# Patient Record
Sex: Female | Born: 1985 | Hispanic: Yes | Marital: Married | Smoking: Never smoker
Health system: Southern US, Community
[De-identification: ages and names within clinical notes are randomized; demographics above are authoritative.]

## PROBLEM LIST (undated history)

## (undated) ENCOUNTER — Inpatient Hospital Stay: Payer: Self-pay

---

## 2015-03-09 HISTORY — PX: PLACEMENT OF BREAST IMPLANTS: SHX6334

## 2019-05-26 ENCOUNTER — Ambulatory Visit: Payer: Self-pay | Attending: Internal Medicine

## 2019-05-26 DIAGNOSIS — Z23 Encounter for immunization: Secondary | ICD-10-CM

## 2019-05-26 NOTE — Progress Notes (Signed)
   Covid-19 Vaccination Clinic  Name:  Courtney Newman    MRN: [id number] DOB: [birth year]  05/26/2019  [name] was observed post Covid-19 immunization for 15 minutes without incident. She was provided with Vaccine Information Sheet and instruction to access the V-Safe system.   [name] was instructed to call 911 with any severe reactions post vaccine: Marland Kitchen Difficulty breathing  . Swelling of face and throat  . A fast heartbeat  . A bad rash all over body  . Dizziness and weakness   Immunizations Administered    Name Date Dose VIS Date Route   Pfizer COVID-19 Vaccine 05/26/2019  9:09 AM 0.3 mL 02/16/2019 Intramuscular   Manufacturer: [employer], [employer]   Lot: [id number]   NDC: [address]-7

## 2019-06-16 ENCOUNTER — Ambulatory Visit: Payer: Self-pay | Attending: Internal Medicine

## 2019-06-16 DIAGNOSIS — Z23 Encounter for immunization: Secondary | ICD-10-CM

## 2019-06-16 NOTE — Progress Notes (Signed)
   Covid-19 Vaccination Clinic  Name:  Zakiah Gauthreaux    MRN: [id number] DOB: 02-24-1986  06/16/2019  Ms. Poole was observed post Covid-19 immunization for 15 minutes   without incident. She was provided with Vaccine Information Sheet and instruction to access the V-Safe system.   Ms. Pak was instructed to call 911 with any severe reactions post vaccine: Marland Kitchen Difficulty breathing  . Swelling of face and throat  . A fast heartbeat  . A bad rash all over body  . Dizziness and weakness   Immunizations Administered    Name Date Dose VIS Date Route   Pfizer COVID-19 Vaccine 06/16/2019  9:22 AM 0.3 mL 02/16/2019 Intramuscular   Manufacturer: [employer], [employer]   Lot: [id number]   NDC: [address]-3

## 2021-03-08 NOTE — L&D Delivery Note (Signed)
Operative Delivery Note At 12:21 AM a viable and healthy female was delivered via Vaginal, Spontaneous.  Presentation: vertex; Position: Right,, Occiput,, Anterior;  Tight nuchal cord, doubly clamped and cut on the perineum  Delivery of the head: 40 sec shoulder dystocia Deep McRoberts after straightening legs Second maneuver: delivery of posterior arm  Verbal consent: obtained from patient.  APGAR: 7, 9; weight  .   Placenta status: intact, .   Cord: 3VC with the following complications: tight nuchal  Cord pH: 7.26  Anesthesia:  epidural Episiotomy: None Lacerations: None Est. Blood Loss (mL):  500  Mom to postpartum.  Baby to Couplet care / Skin to Skin.  Courtney Newman 01/17/2022, 12:51 AM

## 2021-06-06 DIAGNOSIS — H6690 Otitis media, unspecified, unspecified ear: Secondary | ICD-10-CM

## 2021-06-06 HISTORY — DX: Otitis media, unspecified, unspecified ear: H66.90

## 2021-06-09 ENCOUNTER — Ambulatory Visit (LOCAL_COMMUNITY_HEALTH_CENTER): Payer: Medicaid Other

## 2021-06-09 VITALS — BP 102/73 | Ht 59.0 in | Wt 123.0 lb

## 2021-06-09 DIAGNOSIS — Z3201 Encounter for pregnancy test, result positive: Secondary | ICD-10-CM

## 2021-06-09 LAB — PREGNANCY, URINE: Preg Test, Ur: POSITIVE — AB

## 2021-06-09 MED ORDER — PRENATAL 27-0.8 MG PO TABS: 1.0000 | ORAL_TABLET | Freq: Every day | ORAL | 0 refills | Status: AC

## 2021-06-09 NOTE — Progress Notes (Addendum)
UPT positive. Plans prenatal care at [facility]. Per pt, taking Amoxicillin for ear infection (R ear). Consult E Sciora, CNM who states Amoxicillin ok for pregnancy. Pt sent to clerk for preadmit. M Yemen, interpreter. Jerel Shepherd, RN ? ?Consulted on the plan of care for this client.  I agree with the documented note and actions taken to provide care for this client.  Hazle Coca, CNM ? ? ?

## 2021-07-16 ENCOUNTER — Ambulatory Visit: Payer: Medicaid Other | Admitting: Advanced Practice Midwife

## 2021-07-16 ENCOUNTER — Encounter: Payer: Self-pay | Admitting: Advanced Practice Midwife

## 2021-07-16 VITALS — BP 104/71 | HR 82 | Temp 98.1°F | Wt 125.4 lb

## 2021-07-16 DIAGNOSIS — Z8751 Personal history of pre-term labor: Secondary | ICD-10-CM

## 2021-07-16 DIAGNOSIS — Z9889 Other specified postprocedural states: Secondary | ICD-10-CM | POA: Insufficient documentation

## 2021-07-16 DIAGNOSIS — O0991 Supervision of high risk pregnancy, unspecified, first trimester: Secondary | ICD-10-CM | POA: Insufficient documentation

## 2021-07-16 DIAGNOSIS — Z9882 Breast implant status: Secondary | ICD-10-CM | POA: Insufficient documentation

## 2021-07-16 DIAGNOSIS — O09521 Supervision of elderly multigravida, first trimester: Secondary | ICD-10-CM | POA: Diagnosis not present

## 2021-07-16 DIAGNOSIS — Z98891 History of uterine scar from previous surgery: Secondary | ICD-10-CM | POA: Insufficient documentation

## 2021-07-16 DIAGNOSIS — O09529 Supervision of elderly multigravida, unspecified trimester: Secondary | ICD-10-CM | POA: Insufficient documentation

## 2021-07-16 DIAGNOSIS — Z23 Encounter for immunization: Secondary | ICD-10-CM | POA: Diagnosis not present

## 2021-07-16 DIAGNOSIS — R6252 Short stature (child): Secondary | ICD-10-CM | POA: Insufficient documentation

## 2021-07-16 DIAGNOSIS — Z9851 Tubal ligation status: Secondary | ICD-10-CM | POA: Insufficient documentation

## 2021-07-16 LAB — WET PREP FOR TRICH, YEAST, CLUE
Clue Cell Exam: POSITIVE — AB
Trichomonas Exam: NEGATIVE

## 2021-07-16 LAB — URINALYSIS
Bilirubin, UA: NEGATIVE
Glucose, UA: NEGATIVE
Ketones, UA: NEGATIVE
Nitrite, UA: NEGATIVE
Protein,UA: NEGATIVE
RBC, UA: NEGATIVE
Specific Gravity, UA: 1.015 (ref 1.005–1.030)
Urobilinogen, Ur: 0.2 mg/dL (ref 0.2–1.0)
pH, UA: 6 (ref 5.0–7.5)

## 2021-07-16 LAB — HEMOGLOBIN, FINGERSTICK: Hemoglobin: 13.8 g/dL (ref 11.1–15.9)

## 2021-07-16 NOTE — Progress Notes (Signed)
[facility] Department  ?Maternal Health Clinic ? ? ?INITIAL PRENATAL VISIT NOTE ? ?Subjective:  ?Courtney Newman is a 36 y.o. seperated HF [id number] (12, 7) nonsmoker at [redacted]w[redacted]d being seen today to start prenatal care at the [facility] Department. She feels "fine" about surprise pregnancy with condoms. 36 yo FOB feels "he says it's ok" about pregnancy; he is the father of all of her children; she has been with him since she was 72 and they married when she was 73, but they are seperated now.  She moved to [place] in 2004 from [place] and is working 40 hrs/wk and living with her 2 kids. LMP 04/17/21. Denies u/s this pregnancy. Has been to urgent care x2 this pregnancy (first for ear infection and constipation and second time because didn't feel well, constipation, mucousy, flu).  C/o epigastric pain constantly onset before pregnancy aggravated with drinking soda or eating spicy food or drinking water, relieved slightly with drinking [place] Aloe, pain scale now=8/10. Counseled to try chamomile tea,elevate head after eating, eat small portions, go to ER if worsens. Last dental exam 2022. Last ETOH 03/01/21 (1 glass wine). Denies cigs, vaping, cigars, MJ.  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 ? ?Patient reports  epigastric pain onset before pregnancy .   .  .   . Denies leaking of fluid.  ? ?Indications for ASA therapy (per uptodate) ?One of the following: ?Previous pregnancy with preeclampsia, especially early onset and with an adverse outcome No ?Multifetal gestation No ?Chronic hypertension No ?Type 1 or 2 diabetes mellitus No ?Chronic kidney disease No ?Autoimmune  disease (antiphospholipid syndrome, systemic lupus erythematosus) No ? ?Two or more of the following: ?Nulliparity No ?Obesity (body mass index >30 kg/m2) No ?Family history of preeclampsia in mother or sister No ?Age ?35 years Yes ?Sociodemographic characteristics (African American race, low socioeconomic level) No ?Personal risk factors (eg, previous pregnancy with low birth weight or small for gestational age infant, previous adverse pregnancy outcome [eg, stillbirth], interval >10 years between pregnancies) Yes ? ? ?The following portions of the patient's history were reviewed and updated as appropriate: allergies, current medications, past family history, past medical history, past social history, past surgical history and problem list. Problem list updated. ? ?Objective:  ? ?Vitals:  ? 07/16/21 1426  ?BP: 104/71  ?Pulse: 82  ?Temp: 98.1 ?F (36.7 ?C)  ?Weight: 125 lb 6.4 oz (56.9 kg)  ? ? ?Fetal Status:          ? ? ?Physical Exam ?Vitals and nursing note reviewed.  ?Constitutional:   ?   General: She is not in acute distress. ?   Appearance: Normal appearance. She is well-developed and normal weight.  ?HENT:  ?   Head: Normocephalic and atraumatic.  ?   Right Ear: External ear normal.  ?   Left Ear: External ear normal.  ?   Nose: Nose normal. No congestion or rhinorrhea.  ?   Mouth/Throat:  ?   Lips: Pink.  ?   Mouth: Mucous membranes are moist.  ?   Dentition: Normal dentition. No dental caries.  ?   Pharynx: Oropharynx is clear. Uvula midline.  ?  Comments: Dentition: good  ?Last dental exam 2022 ?Eyes:  ?   General: No scleral icterus. ?   Conjunctiva/sclera: Conjunctivae normal.  ?Neck:  ?   Thyroid: No thyroid mass or thyromegaly.  ?Cardiovascular:  ?   Rate and Rhythm: Normal rate.  ?   Pulses: Normal pulses.  ?   Comments: Extremities are warm and well perfused ?Pulmonary:  ?   Effort: Pulmonary effort is normal.  ?   Breath sounds: Normal breath sounds.  ?Chest:  ?   Chest wall: No mass.  ?Breasts: ?    Tanner Score is 5.  ?   Breasts are symmetrical.  ?   Right: Normal. No mass, nipple discharge or skin change.  ?   Left: Normal. No mass, nipple discharge or skin change.  ?   Comments: Breast augmentation 2017 ?Abdominal:  ?   General: Abdomen is flat.  ?   Palpations: Abdomen is soft.  ?   Tenderness: There is no abdominal tenderness.  ?   Comments: Gravid, large scar from hip to hip from tummy tuck 2017 ?Soft without masses or tenderness, 12 wk size and FHR=160  ?Genitourinary: ?   General: Normal vulva.  ?   Exam position: Lithotomy position.  ?   Pubic Area: No rash.   ?   Labia:     ?   Right: No rash.     ?   Left: No rash.   ?   Vagina: Vaginal discharge (white creamy leukorrhea, ph<4.5) present.  ?   Cervix: Friability (friable to pap) present. No cervical motion tenderness.  ?   Uterus: Normal. Enlarged (Gravid 12 wk size). Not tender.   ?   Adnexa: Right adnexa normal and left adnexa normal.  ?   Rectum: Normal. No external hemorrhoid.  ?Musculoskeletal:  ?   Right lower leg: No edema.  ?   Left lower leg: No edema.  ?Lymphadenopathy:  ?   Cervical: No cervical adenopathy.  ?   Upper Body:  ?   Right upper body: No axillary adenopathy.  ?   Left upper body: No axillary adenopathy.  ?Skin: ?   General: Skin is warm.  ?   Capillary Refill: Capillary refill takes less than 2 seconds.  ?Neurological:  ?   Mental Status: She is alert.  ? ? ?Assessment and Plan:  ?Pregnancy: [id number] at [redacted]w[redacted]d ? ?1. Supervision of high risk pregnancy in first trimester ?MFM u/s for dating ordered + NIPS with genetic counseling ?Counseled on weight gain of 25-30 lbs ?Please give dental list to pt ? ?- Chlamydia/GC NAA, Confirmation ?- Hgb Fractionation Cascade ?- HCV Ab w Reflex to Quant PCR ?- Urine Culture ?- CBC/D/Plt+RPR+Rh+ABO+Rub Ab... ?- HIV-1/HIV-2 Qualitative RNA ?- QuantiFERON-TB Gold Plus ?- IGP, Aptima HPV ?- [id number] Drug Screen ?- WET PREP FOR TRICH, YEAST, CLUE ?- Hemoglobin, venipuncture ?- Urinalysis (Urine  Dip) ?- [place] MFM OB COMPLETE LESS THAN 14 WEEKS; Future ? ?2. History of preterm delivery at 36 wks 10/16/2008 4#4 ?Monitor for PTL ? ?3. H/O cesarean section 09/09/13 ?Wants TOLAC ? ?4. Low birth weight infants x2 (5#8, 4#4) ?monitor ? ?5. 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 ? ?6. Hx of breast augmentation 2017 [place] ?May not be able to successfully breastfeed; monitor newborn weight carefully ? ?7. Hx of tummy tuck 2017 [place] ? ? ? ? ?Discussed overview of care and coordination with inpatient delivery practices including [facility], [facility], [facility] and [facility].  ? ?Reviewed Centering pregnancy as standard  of care at [facility] ? ? ?Preterm labor symptoms and general obstetric precautions including but not limited to vaginal bleeding, contractions, leaking of fluid and fetal movement were reviewed in detail with the patient. ? ?Please refer to After Visit Summary for other counseling recommendations.  ? ?No follow-ups on file. ? ?No future appointments. ? ?Herbie Saxon, CNM ? ?

## 2021-07-16 NOTE — Progress Notes (Signed)
Patient here for new OB appt at 12 6/7. States she had a "tummy tuck" in 2017. 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 Also states she had breast implants placed in 2017. Has questions about breastfeeding with breast implants. Patient states she and spouse separated, and he is the father of their 2 children. She lives with her 2 children and has financial support from her husband. Complaining of pain in her upper abdomen that hurts more after she eats. States she went to urgent care and was told to try Pepcid, and states that does not help her pain. States she take aloe vera for the pain and it calms it down a little bit. Desires flu vaccine today.Burt Knack, RN  ?

## 2021-07-16 NOTE — Progress Notes (Signed)
Wet mount reviewed, patient treated for yeast per provider orders. Hgb 13.8 today. Flu vaccine given today, tolerated well, VIS given. [facility] U/S referral faxed with confirmation. Burt Knack, RN  ?

## 2021-07-17 LAB — CBC/D/PLT+RPR+RH+ABO+RUB AB...
Antibody Screen: NEGATIVE
Basophils Absolute: 0.1 10*3/uL (ref 0.0–0.2)
Basos: 1 %
EOS (ABSOLUTE): 0.2 10*3/uL (ref 0.0–0.4)
Eos: 2 %
Hematocrit: 40 % (ref 34.0–46.6)
Hemoglobin: 14 g/dL (ref 11.1–15.9)
Hepatitis B Surface Ag: NEGATIVE
Immature Grans (Abs): 0.1 10*3/uL (ref 0.0–0.1)
Immature Granulocytes: 1 %
Lymphocytes Absolute: 2.6 10*3/uL (ref 0.7–3.1)
Lymphs: 25 %
MCH: 30.3 pg (ref 26.6–33.0)
MCHC: 35 g/dL (ref 31.5–35.7)
MCV: 87 fL (ref 79–97)
Monocytes Absolute: 0.8 10*3/uL (ref 0.1–0.9)
Monocytes: 8 %
Neutrophils Absolute: 6.8 10*3/uL (ref 1.4–7.0)
Neutrophils: 63 %
Platelets: 254 10*3/uL (ref 150–450)
RBC: 4.62 x10E6/uL (ref 3.77–5.28)
RDW: 12.4 % (ref 11.7–15.4)
RPR Ser Ql: NONREACTIVE
Rh Factor: POSITIVE
Rubella Antibodies, IGG: 15.2 index (ref >0.99)
Varicella zoster IgG: 1384 index (ref >165)
WBC: 10.6 10*3/uL (ref 3.4–10.8)

## 2021-07-17 LAB — HCV AB W REFLEX TO QUANT PCR: HCV Ab: NONREACTIVE

## 2021-07-17 LAB — HCV INTERPRETATION

## 2021-07-18 LAB — 789231 7+OXYCODONE-BUND
Amphetamines, Urine: NEGATIVE ng/mL
BENZODIAZ UR QL: NEGATIVE ng/mL
Barbiturate screen, urine: NEGATIVE ng/mL
Cannabinoid Quant, Ur: NEGATIVE ng/mL
Cocaine (Metab.): NEGATIVE ng/mL
OPIATE SCREEN URINE: NEGATIVE ng/mL
Oxycodone/Oxymorphone, Urine: NEGATIVE ng/mL
PCP Quant, Ur: NEGATIVE ng/mL

## 2021-07-19 LAB — CHLAMYDIA/GC NAA, CONFIRMATION
Chlamydia trachomatis, NAA: NEGATIVE
Neisseria gonorrhoeae, NAA: NEGATIVE

## 2021-07-19 LAB — URINE CULTURE

## 2021-07-22 LAB — HGB FRACTIONATION CASCADE
Hgb A2: 3.2 % (ref 1.8–3.2)
Hgb A: 96.8 % (ref 96.4–98.8)
Hgb F: 0 % (ref 0.0–2.0)
Hgb S: 0 %

## 2021-07-22 LAB — QUANTIFERON-TB GOLD PLUS
QuantiFERON Mitogen Value: >10 IU/mL
QuantiFERON Nil Value: 0.19 IU/mL
QuantiFERON TB1 Ag Value: 0.3 IU/mL
QuantiFERON TB2 Ag Value: 0.3 IU/mL
QuantiFERON-TB Gold Plus: NEGATIVE

## 2021-07-22 LAB — HIV-1/HIV-2 QUALITATIVE RNA
HIV-1 RNA, Qualitative: NONREACTIVE
HIV-2 RNA, Qualitative: NONREACTIVE

## 2021-07-23 LAB — IGP, APTIMA HPV
HPV Aptima: NEGATIVE
PAP Smear Comment: 0

## 2021-07-28 ENCOUNTER — Encounter: Payer: Self-pay | Admitting: Advanced Practice Midwife

## 2021-07-28 ENCOUNTER — Telehealth: Payer: Self-pay

## 2021-07-28 DIAGNOSIS — O2340 Unspecified infection of urinary tract in pregnancy, unspecified trimester: Secondary | ICD-10-CM | POA: Insufficient documentation

## 2021-07-28 NOTE — Telephone Encounter (Signed)
TC to patient to inform of UTI and need for treatment. LM with number to call. No emergency contact. Interpreter, M. Yemen.Burt Knack, RN

## 2021-07-29 NOTE — Telephone Encounter (Signed)
Client returned call and then RN called her back with [facility] [place] interpreting. Counseled on need for UTI treatment and client requested late pm appt. Appt scheduled for 07/31/2021 with arrival time of 1545.Rich Number, RN

## 2021-07-29 NOTE — Telephone Encounter (Signed)
Call to client with [facility] Interpreters ID # [phone] as needs appt for UTI treatment. Left message to call notifying her of need for treatment and requested she call ASAP to schedule appt. Number to call provided. Jossie Ng, RN

## 2021-07-31 ENCOUNTER — Ambulatory Visit: Payer: Medicaid Other | Admitting: Nurse Practitioner

## 2021-07-31 VITALS — BP 102/70 | HR 77 | Temp 97.5°F | Wt 129.0 lb

## 2021-07-31 DIAGNOSIS — O0992 Supervision of high risk pregnancy, unspecified, second trimester: Secondary | ICD-10-CM

## 2021-07-31 DIAGNOSIS — O2340 Unspecified infection of urinary tract in pregnancy, unspecified trimester: Secondary | ICD-10-CM

## 2021-07-31 NOTE — Progress Notes (Unsigned)
C/o epigastric pain that has improved since has taken medications. Now rates epigastric pain 6/10. No vomiting and some nausea.  C/o Burning with urination.

## 2021-07-31 NOTE — Progress Notes (Unsigned)
[facility] Spanish Interpretor utilized at visit Suzette Battiest).

## 2021-08-01 ENCOUNTER — Encounter: Payer: Self-pay | Admitting: Nurse Practitioner

## 2021-08-01 MED ORDER — NITROFURANTOIN MONOHYD MACRO 100 MG PO CAPS
100.0000 mg | ORAL_CAPSULE | Freq: Two times a day (BID) | ORAL | 0 refills | Status: DC
Start: 2021-08-01 — End: 2021-09-11

## 2021-08-01 NOTE — Progress Notes (Unsigned)
36 year old female in clinic today to receive treatment for UTI.  Patient is 15w 0d.  Reviewed results with patient and informed her that she would need medication to treat infection.  Assessed patient for any lower back or abdominal pain, burning, urgency, and/or frequency when she urinates.  Patient reports that she has been having some burning when she urinates.  Reviewed medication with patient and the importance of completing all of the medication.  Also reviewed proper hygiene and triggers for UTIs.  Advised to call with any additional questions or concerns.  Will need a TOC at completion of medication.  Glenna Fellows, FNP    The patient was dispensed Macrobid 100 MG PO BID x 7 days today. I provided counseling today regarding the medication. We discussed the medication, the side effects and when to call clinic. Patient given the opportunity to ask questions. Questions answered.

## 2021-08-02 NOTE — Progress Notes (Unsigned)
Chart reviewed by Pharmacist  Suzanne Walker PharmD, Contract Pharmacist at [facility]  

## 2021-08-10 ENCOUNTER — Telehealth: Payer: Self-pay

## 2021-08-10 NOTE — Telephone Encounter (Addendum)
Call earlier in pm to verify if [facility] appt scheduled. Per female at clinic, appt scheduled for 10/16/2021 at 1330 and to arrive by 1315. She stated client should receive a letter in the mail with appt. Call to client with [facility] [employer]pre[employer] ID # [phone] to verify aware of appt. Per client, her letter from [facility] states appt is 10/13/2021. Client to bring letter to [facility] appt 08/14/2021 and RN can call [facility] GI at that time to verify appt. Rich Number, RN Call to [facility] 08/13/2021 to verify client's appt. Per receptionist, appt is  10/13/2021 at 1330 as client's letter indicates. Rich Number, RN

## 2021-08-12 ENCOUNTER — Other Ambulatory Visit: Payer: Self-pay | Admitting: Advanced Practice Midwife

## 2021-08-12 DIAGNOSIS — O0991 Supervision of high risk pregnancy, unspecified, first trimester: Secondary | ICD-10-CM

## 2021-08-13 ENCOUNTER — Other Ambulatory Visit: Payer: Self-pay

## 2021-08-13 ENCOUNTER — Telehealth: Payer: Self-pay

## 2021-08-13 NOTE — Addendum Note (Signed)
Addended by: Cletis Media on: 08/13/2021 11:59 AM   Modules accepted: Orders

## 2021-08-13 NOTE — Telephone Encounter (Signed)
TC to patient to inform of 08/18/21 [facility] U/S at 1:00pm. Patient needs to be informed to arrive at 12:45, no children under age 37 may be present at U/S. LM with number to call.Burt Knack, RN

## 2021-08-14 ENCOUNTER — Ambulatory Visit: Payer: Medicaid Other | Admitting: Nurse Practitioner

## 2021-08-14 ENCOUNTER — Telehealth: Payer: Self-pay

## 2021-08-14 VITALS — BP 100/70 | HR 81 | Temp 97.0°F | Wt 130.2 lb

## 2021-08-14 DIAGNOSIS — O0991 Supervision of high risk pregnancy, unspecified, first trimester: Secondary | ICD-10-CM

## 2021-08-14 DIAGNOSIS — O2340 Unspecified infection of urinary tract in pregnancy, unspecified trimester: Secondary | ICD-10-CM

## 2021-08-14 NOTE — Progress Notes (Unsigned)
Reminded of [place] appointment 06/13 at 1 pm; Instructions given and verbalizes understanding.  Peds: undecided. Peds List given. [facility] Financial Assistance Application given in Spanish.  Encouraged patient to call phone # on form regarding cost of BTL.  Txd  for UTI 07/31/2021. Urine collected for C and S.  Reminded patient of GI consult appointment 08/08 at 1330. Patient was aware. Utilized [place] Interpretor during visit # [id number].

## 2021-08-14 NOTE — Telephone Encounter (Signed)
Telephone call to patient this morning regarding her [facility] appointment 08-18-21 at 1 pm.  Patient reports speaking with someone yesterday.  Language Line used today.  [place] / [phone].  Dahlia Bailiff, RN

## 2021-08-16 ENCOUNTER — Encounter: Payer: Self-pay | Admitting: Nurse Practitioner

## 2021-08-16 NOTE — Progress Notes (Signed)
[place] [facility] Maternal Health Clinic  PRENATAL VISIT NOTE  Subjective:  Courtney Newman is a 36 y.o. G3P1102 at [redacted]w[redacted]d being seen today for ongoing prenatal care.  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  Patient reports  pain in lower legs and itching .  Contractions: Not present. Vag. Bleeding: None.  Movement: Absent. Denies leaking of fluid/ROM.   The following portions of the patient's history were reviewed and updated as appropriate: allergies, current medications, past family history, past medical history, past social history, past surgical history and problem list. Problem list updated.  Objective:   Vitals:   08/14/21 1609  BP: 100/70  Pulse: 81  Temp: (!) 97 F (36.1 C)  Weight: 130 lb 3.2 oz (59.1 kg)    Fetal Status: Fetal Heart Rate (bpm): 140 Fundal Height: 17 cm Movement: Absent     General:  Alert, oriented and cooperative. Patient is in no acute distress.  Skin: Skin is warm and dry. No rash noted.   Cardiovascular: Normal heart rate noted  Respiratory: Normal respiratory effort, no problems with respiration noted  Abdomen: Soft, gravid, appropriate for gestational age.  Pain/Pressure: Present     Pelvic: Cervical exam deferred        Extremities: Normal range of motion.  Edema: None  Mental Status: Normal mood and affect. Normal behavior. Normal judgment and thought content.   Assessment and Plan:  Pregnancy: G3P1102 at [redacted]w[redacted]d  1. Urinary tract infection in mother during pregnancy, antepartum -History of UTI.   Patient tested positive 07/16/21 for Group B Strep.  Completed medication.  Will receive TOC today.  - Urine Culture  2. Supervision of high risk pregnancy in first trimester -36 year old female in clinic today for prenatal care. -Patient states she is taking her PNV daily. -Patient has complaints of itching between her breast and inner thigh.  Small rash noted to both areas.  Advised patient to keep areas dry and may apply Aquaphor or Hydrocortisone to sites.   Patient states she also has a history of varicose veins and has been experiencing some increase pain and sensitivity to legs.  Advised patient to apply compression stockings for support and comfort.  Will continue to monitor and follow up with any additional recommendations.   -Patient continues to have some epigastric pain.  Continues to take antiacids.  Has GI consult scheduled for 10/2021.    Term labor symptoms and general obstetric precautions including but not limited to vaginal bleeding, contractions, leaking of fluid and fetal movement were reviewed in detail with the patient. Please refer to After Visit Summary for other counseling recommendations.   Return in about 4 weeks (around 09/11/2021) for Routine prenatal care visit.  Future Appointments  Date Time Provider [place]  08/18/2021  1:00 PM [facility]  09/11/2021  8:40 AM [facility] PROVIDER [facility] None    Gregary Cromer, FNP

## 2021-08-17 LAB — URINE CULTURE

## 2021-08-18 ENCOUNTER — Ambulatory Visit: Payer: Self-pay

## 2021-08-18 ENCOUNTER — Other Ambulatory Visit
Admission: RE | Admit: 2021-08-18 | Discharge: 2021-08-18 | Disposition: A | Discharge status: Home / Self Care | Payer: Medicaid Other | Source: Ambulatory Visit | Attending: Maternal & Fetal Medicine | Admitting: Maternal & Fetal Medicine

## 2021-08-18 ENCOUNTER — Other Ambulatory Visit: Payer: Self-pay

## 2021-08-18 ENCOUNTER — Other Ambulatory Visit: Payer: Self-pay | Admitting: Advanced Practice Midwife

## 2021-08-18 ENCOUNTER — Ambulatory Visit (HOSPITAL_BASED_OUTPATIENT_CLINIC_OR_DEPARTMENT_OTHER): Payer: Medicaid Other | Admitting: Obstetrics and Gynecology

## 2021-08-18 ENCOUNTER — Ambulatory Visit (HOSPITAL_BASED_OUTPATIENT_CLINIC_OR_DEPARTMENT_OTHER): Payer: Medicaid Other

## 2021-08-18 DIAGNOSIS — O09212 Supervision of pregnancy with history of pre-term labor, second trimester: Secondary | ICD-10-CM

## 2021-08-18 DIAGNOSIS — Z3A16 16 weeks gestation of pregnancy: Secondary | ICD-10-CM

## 2021-08-18 DIAGNOSIS — O09522 Supervision of elderly multigravida, second trimester: Secondary | ICD-10-CM | POA: Diagnosis present

## 2021-08-18 DIAGNOSIS — O34219 Maternal care for unspecified type scar from previous cesarean delivery: Secondary | ICD-10-CM | POA: Insufficient documentation

## 2021-08-18 DIAGNOSIS — O0992 Supervision of high risk pregnancy, unspecified, second trimester: Secondary | ICD-10-CM | POA: Insufficient documentation

## 2021-08-18 DIAGNOSIS — O0991 Supervision of high risk pregnancy, unspecified, first trimester: Secondary | ICD-10-CM

## 2021-08-18 NOTE — Progress Notes (Signed)
Referring Provider:  Alberteen Spindle Length of Consultation: 30 minutes  Ms. Luhman was referred to [facility] at [facility] for genetic counseling because of advanced maternal age.  The patient will be 36 years old at the time of delivery.  This note summarizes the information we discussed.    We explained that the chance of a chromosome abnormality increases with maternal age.  Chromosomes and examples of chromosome problems were reviewed.  Humans typically have 46 chromosomes in each cell, with half passed through each sperm and egg.  Any change in the number or structure of chromosomes can increase the risk of problems in the physical and mental development of a pregnancy.   Based upon age of the patient and the current gestational age, the chance of any chromosome abnormality was 1 in 5. The chance of Down syndrome, the most common chromosome problem associated with maternal age, was 1 in 44.  The risk of chromosome problems is in addition to the 3% general population risk for birth defects and mental retardation.  The greatest chance, of course, is that the baby would be born in good health.  We discussed the following prenatal screening and testing options for this pregnancy:  Cell free fetal DNA testing from maternal blood may be used to determine whether or not the baby may have Down syndrome, trisomy 9, or trisomy 78.  This test utilizes a maternal blood sample and DNA sequencing technology to isolate circulating cell free fetal DNA from maternal plasma.  The fetal DNA can then be analyzed for DNA sequences that are derived from the three most common chromosomes involved in aneuploidy, chromosomes 13, 18, and 21.  If the overall amount of DNA is greater than the expected level for any of these chromosomes, aneuploidy is suspected.  While we do not consider it a replacement for invasive testing and karyotype analysis, a negative result from this testing would be reassuring,  though not a guarantee of a normal chromosome complement for the baby.  An abnormal result is certainly suggestive of an abnormal chromosome complement, though we would still recommend amniocentesis to confirm any findings from this testing.  Maternal serum marker screening, a blood test that measures pregnancy proteins, can provide risk assessments for Down syndrome, trisomy 18, and open neural tube defects (spina bifida, anencephaly). Because it does not directly examine the fetus, it cannot positively diagnose or rule out these problems. If cell free fetal DNA testing is performed, then an AFP only is recommended.  Targeted ultrasound uses high frequency sound waves to create an image of the developing fetus.  An ultrasound is often recommended as a routine means of evaluating the pregnancy.  It is also used to screen for fetal anatomy problems (for example, a heart defect) that might be suggestive of a chromosomal or other abnormality.   Amniocentesis involves the removal of a small amount of amniotic fluid from the sac surrounding the fetus with the use of a thin needle inserted through the maternal abdomen and uterus.  Ultrasound guidance is used throughout the procedure.  Fetal cells from amniotic fluid are directly evaluated and > 99.5% of chromosome problems and > 98% of open neural tube defects can be detected. This procedure is generally performed after the 15th week of pregnancy.  The main risks to this procedure include complications leading to miscarriage in less than 1 in 200 cases (0.5%).   Cystic Fibrosis and Spinal Muscular Atrophy (SMA) screening were also discussed with the  patient. Both conditions are recessive, which means that both parents must be carriers in order to have a child with the disease.  Cystic fibrosis (CF) is one of the most common genetic conditions in persons of Caucasian ancestry.  This condition occurs in approximately 1 in 2,500 Caucasian persons and results in  thickened secretions in the lungs, digestive, and reproductive systems.  For a baby to be at risk for having CF, both of the parents must be carriers for this condition.  Approximately 1 in 38 Caucasian persons is a carrier for CF.  Current carrier testing looks for the most common mutations in the gene for CF and can detect approximately 90% of carriers in the Caucasian population.  This means that the carrier screening can greatly reduce, but cannot eliminate, the chance for an individual to have a child with CF.  If an individual is found to be a carrier for CF, then carrier testing would be available for the partner. As part of [place]'s newborn screening profile, all babies born in the state of [place] will have a two-tier screening process.  Specimens are first tested to determine the concentration of immunoreactive trypsinogen (IRT).  The top 5% of specimens with the highest IRT values then undergo DNA testing using a panel of over 40 common CF mutations. SMA is a neurodegenerative disorder that leads to atrophy of skeletal muscle and overall weakness.  This condition is also more prevalent in the Caucasian population, with 1 in 40-1 in 60 persons being a carrier and 1 in 6,000-1 in 10,000 children being affected.  There are multiple forms of the disease, with some causing death in infancy to other forms with survival into adulthood.  The genetics of SMA is complex, but carrier screening can detect up to 95% of carriers in the Caucasian population.  Similar to CF, a negative result can greatly reduce, but cannot eliminate, the chance to have a child with SMA. Hemoglobinopathy screening was previously performed at her OB and was normal (AA, MCV 87).  We obtained a detailed family history and pregnancy history.  This is the third pregnancy for this couple  They have a healthy 2 year old son and a healthy 66 year old daughter.  She reported no complications in this pregnancy and no exposure to  medications, alcohol, tobacco or recreational drugs in this pregnancy. The family history is unremarkable for birth defects, developmental delays, recurrent pregnancy loss or known chromosome abnormalities.  An ultrasound was performed at the time of the visit.  The gestational age was consistent with 16 weeks.   No markers of aneuploidy were noted, though the evaluation of the fetal anatomy was limited due to the early gestational age.  It is important to remember that a normal ultrasound does not exclude the possibility of birth defect or chromosome condition.  Please refer to the ultrasound report for details of that study.  Ms. Oien was encouraged to call with questions or concerns.  We can be contacted at [phone].  Plan of Care: Ms. Brogan elected to pursue the following labs, which were drawn today: MaterniT21 PLUS with SCA, AFP only, Inheritest CF/SMA.   Follow up ultrasound was schedule in 5 weeks to complete anatomy.   [name], MS, CGC

## 2021-08-19 ENCOUNTER — Encounter: Payer: Self-pay | Admitting: Advanced Practice Midwife

## 2021-08-19 ENCOUNTER — Other Ambulatory Visit: Payer: Self-pay | Admitting: Nurse Practitioner

## 2021-08-19 ENCOUNTER — Telehealth: Payer: Self-pay

## 2021-08-19 DIAGNOSIS — O2340 Unspecified infection of urinary tract in pregnancy, unspecified trimester: Secondary | ICD-10-CM

## 2021-08-19 MED ORDER — CEPHALEXIN 500 MG PO CAPS: 500.0000 mg | ORAL_CAPSULE | Freq: Three times a day (TID) | ORAL | 0 refills | Status: DC

## 2021-08-19 NOTE — Telephone Encounter (Signed)
Per Glenna Fellows FNP - [facility], client needs treatment for UTI. Call to client to counsel her regarding need for treatment. [name] to e-prescribe antibiotic as client has Medicaid (verified by M. Tacey Heap, [employer]). Jossie Ng, RN

## 2021-08-19 NOTE — Progress Notes (Signed)
Positive GBS on urine culture 08/14/21.  Prescription sent to pharmacy for Keflex 500MG  PO TID x 7 days.  , FNP    1. Urinary tract infection in mother during pregnancy, antepartum  - cephALEXin (KEFLEX) 500 MG capsule; Take 1 capsule (500 mg total) by mouth 3 (three) times daily.  Dispense: 21 capsule; Refill: 0

## 2021-08-20 LAB — MISC LABCORP TEST (SEND OUT): Labcorp test code: 10801

## 2021-08-21 ENCOUNTER — Telehealth: Payer: Self-pay

## 2021-08-21 NOTE — Telephone Encounter (Signed)
Utilized [employer] [phone]) during return phone call. Patient notified that antibiotic for UTI treatment was sent to her pharmacy. Patient verbalized understanding. Confirmed Pharmacy preference. Delynn Flavin RN

## 2021-08-21 NOTE — Telephone Encounter (Signed)
Telephone call to patient to counsel regarding the need for UTI Tx per Glenna Fellows, NP.  Left message for patient to call the maternity clinic and speak with a nurse at [phone].  Language Line used today.  De Nurse / [phone].  Hart Carwin, RN

## 2021-08-23 LAB — MATERNIT21 PLUS CORE+SCA
Fetal Fraction: 8
Monosomy X (Turner Syndrome): NOT DETECTED
Result (T21): NEGATIVE
Trisomy 13 (Patau syndrome): NEGATIVE
Trisomy 18 (Edwards syndrome): NEGATIVE
Trisomy 21 (Down syndrome): NEGATIVE
XXX (Triple X Syndrome): NOT DETECTED
XXY (Klinefelter Syndrome): NOT DETECTED
XYY (Jacobs Syndrome): NOT DETECTED

## 2021-08-25 ENCOUNTER — Telehealth: Payer: Self-pay | Admitting: Obstetrics and Gynecology

## 2021-08-25 LAB — AFP, SERUM, OPEN SPINA BIFIDA
AFP MoM: 2.62
AFP Value: 102.7 ng/mL
Gest. Age on Collection Date: 16.3 weeks
Maternal Age At EDD: 37.5 yr
OSBR Risk 1 IN: 230
Test Results:: POSITIVE — AB
Weight: 131 [lb_av]

## 2021-08-25 NOTE — Telephone Encounter (Signed)
I spoke with Ms. Radovich to convey the results of her MaterniT21 testing and the maternal serum AFP screening.  The patient was informed of the results of her recent MaterniT21 testing which yielded NEGATIVE results.  The patient's specimen showed DNA consistent with two copies of chromosomes 21, 18 and 13.  The sensitivity for trisomy 31, trisomy 74 and trisomy 4 using this testing are reported as 99.1%, 99.9% and 91.7% respectively.  Thus, while the results of this testing are highly accurate, they are not considered diagnostic at this time.  Should more definitive information be desired, the patient may still consider amniocentesis.   As requested to know by the patient, sex chromosome analysis was included for this sample.  Results are consistent with a female fetus. This is predicted with >99% accuracy. This testing also screens for sex chromosome conditions with greater than 96% accuracy and was negative for those conditions.   The AFP results revealed an increased level of AFP.  The value was 2.62 MOM (multiples of the median) which is above the cutoff of 2.50 MOM.  Increased levels of AFP occur for many reasons including: inaccurate pregnancy dating, the presence of twins, pregnancy complications, neural tube defects, abdominal wall defects or a normal pregnancy.  Before MSS screening, her risk to have a child with a neural tube defect was 1 or 2 per 1000. Given the elevated AFP value, the risk is now 1 in 230.  This means that the chance that this baby does not have a neural tube defect is >99.5%.  We discussed the following testing options to follow up on this result:   A repeat ultrasound is already scheduled to fully evaluate the fetal anatomy, particularly the neural tube. A prior ultrasound was performed on 08/18/21 at [redacted] weeks gestation.  Though there was no evidence of a neural tube defect, views of the spine were suboptimally seen due to the early gestational age. She is scheduled to  return to our clinic on 09/22/21 for a follow up ultrasound.  Redraw AFP was also offered because the level of AFP MoM was between 2.50-3.0MoM.  Redrawing maternal blood for a repeat can provide patients with a better assessment of their risk to have a child with a neural tube defect.  Amniocentesis was offered because the AFP MoM exceeded a particular laboratory cutoff.  Amniocentesis can detect greater than 98% of neural tube defects.  It involves the removal of a small amount of amniotic fluid from the sac surrounding the fetus with the use of a thin needle through the maternal abdomen and uterus.  Fetal cells are directly evaluated and approximately 99% of chromosome conditions may also be detected.  Ultrasound guidance is used throughout the procedure. The main risks to this procedure include complications leading to miscarriage in less than 1 in 200 cases (0.5%).    Plan of care: The patient desires to keep her ultrasound at [facility] scheduled for 09/22/21 and plans for a repeat msAFP test at that time.  We may be reached at [phone] with any questions or concerns.  [name], MS, CGC

## 2021-08-28 LAB — MISC LABCORP TEST (SEND OUT): Labcorp test code: 481758

## 2021-09-02 ENCOUNTER — Telehealth: Payer: Self-pay | Admitting: Genetics

## 2021-09-02 NOTE — Telephone Encounter (Signed)
Called Courtney Newman to return carrier screening results. Left voicemail with [facility] call back number. 

## 2021-09-03 ENCOUNTER — Telehealth: Payer: Self-pay | Admitting: Genetics

## 2021-09-03 NOTE — Telephone Encounter (Signed)
Called Courtney Newman to return carrier screening results. Left voicemail with [facility] call back number.

## 2021-09-07 ENCOUNTER — Telehealth: Payer: Self-pay | Admitting: Genetics

## 2021-09-07 NOTE — Telephone Encounter (Signed)
Genetic counseling intern called Raylie with results from her carrier screening with supervision by Patent attorney. 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 SMA is an autosomal recessive disease caused by loss of function mutations in the SMN1 gene. 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 If Shamieka's reproductive partner is found to also be a carrier for SMA, there would be a 25% risk for their offspring together to be affected (have zero functional copies of the SMN1 gene). Individuals with SMA have progressive, proximal, and symmetrical muscle weakness and atrophy due to degeneration of motor neurons of the spine and brainstem. Symptoms can first appear at any time between before birth to adulthood. There are five types of SMA that are historically classified based on clinical presentation, onset of symptoms, and the maximum skill level of motor milestones achieved, however, there is a lot of overlap between types. There are several treatments available for individuals affected with SMA and studies show that treatment is most effective when it is started in the first few months of life. 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 Genetic counseling intern quoted a risk of 1 in 117 for the biological father to be a carrier for SMA given his Hispanic ethnicity. Yahira verbalized understanding of the information discussed and declined carrier screening for SMA on behalf of the biological father. Rashauna stated that she is no longer in a relationship with him. Genetic counseling intern reviewed that SMA is included in [place]'s newborn screening program. Genetic counseling intern also reviewed with Jamiesha that she screened to NOT be  a carrier for Cystic Fibrosis. A negative result on carrier screening reduces the likelihood of being a carrier but does not entirely rule out the possibility.

## 2021-09-11 ENCOUNTER — Ambulatory Visit: Payer: Medicaid Other | Admitting: Advanced Practice Midwife

## 2021-09-11 VITALS — BP 97/63 | HR 97 | Temp 97.5°F | Wt 133.6 lb

## 2021-09-11 DIAGNOSIS — Z8751 Personal history of pre-term labor: Secondary | ICD-10-CM

## 2021-09-11 DIAGNOSIS — R772 Abnormality of alphafetoprotein: Secondary | ICD-10-CM | POA: Insufficient documentation

## 2021-09-11 DIAGNOSIS — Z98891 History of uterine scar from previous surgery: Secondary | ICD-10-CM

## 2021-09-11 DIAGNOSIS — N3000 Acute cystitis without hematuria: Secondary | ICD-10-CM

## 2021-09-11 DIAGNOSIS — O0991 Supervision of high risk pregnancy, unspecified, first trimester: Secondary | ICD-10-CM

## 2021-09-11 DIAGNOSIS — R1013 Epigastric pain: Secondary | ICD-10-CM

## 2021-09-11 DIAGNOSIS — N390 Urinary tract infection, site not specified: Secondary | ICD-10-CM | POA: Insufficient documentation

## 2021-09-11 DIAGNOSIS — O09529 Supervision of elderly multigravida, unspecified trimester: Secondary | ICD-10-CM

## 2021-09-11 DIAGNOSIS — R6252 Short stature (child): Secondary | ICD-10-CM

## 2021-09-11 DIAGNOSIS — O2340 Unspecified infection of urinary tract in pregnancy, unspecified trimester: Secondary | ICD-10-CM

## 2021-09-11 LAB — URINALYSIS
Bilirubin, UA: NEGATIVE
Glucose, UA: NEGATIVE
Ketones, UA: NEGATIVE
Nitrite, UA: NEGATIVE
Protein,UA: NEGATIVE
RBC, UA: NEGATIVE
Specific Gravity, UA: 1.015 (ref 1.005–1.030)
Urobilinogen, Ur: 0.2 mg/dL (ref 0.2–1.0)
pH, UA: 7 (ref 5.0–7.5)

## 2021-09-11 NOTE — Progress Notes (Signed)
Urine dip reviewed. Urine culture ordered by provider. Jossie Ng, RN

## 2021-09-11 NOTE — Progress Notes (Signed)
Patient here for MH RV at 19w 5d.   Patient states she complete ABX treatment for previous UTI on 08/14/21. TOC order placed today.   Patient is aware of f/u Ultrasound with [facility] on 09/22/21 with repeat AFP due to positive AFP screen on 08/18/21.   Patient is complaining today of itch all over her body that started 3 weeks ago. She has purchase anti-itch cream but states it only helps for a little bit. Provider made aware.   Patient also states her varicose veins in legs are worse and more painful especially after working 8 hours shift standing. Patient states she yet has not bought compression socks as recommended in the prior visit by provider. Provider today made aware and plans to educate patient on compression stockings.   Earlyne Iba, RN

## 2021-09-11 NOTE — Progress Notes (Signed)
[facility]  PRENATAL VISIT NOTE  Subjective:  Courtney Newman is a 36 y.o. G3P1102 at [redacted]w[redacted]d being seen today for ongoing prenatal care.  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  Patient reports  numerous c/o .  Contractions: Not present. Vag. Bleeding: None.  Movement: Present. Denies leaking of fluid/ROM.   The following portions of the patient's history were reviewed and updated as appropriate: allergies, current medications, past family history, past medical history, past social history, past surgical history and problem list. Problem list updated.  Objective:   Vitals:   09/11/21 0830  BP: 97/63  Pulse: 97  Temp: (!) 97.5 F (36.4 C)  Weight: 133 lb 9.6 oz (60.6 kg)    Fetal Status: Fetal Heart Rate (bpm): 150 Fundal Height: 19 cm Movement: Present     General:  Alert, oriented and cooperative. Patient is in no acute distress.  Skin: Skin is warm and dry. No rash noted.   Cardiovascular: Normal heart rate noted  Respiratory: Normal respiratory effort, no problems with respiration noted  Abdomen: Soft, gravid, appropriate for gestational age.  Pain/Pressure: Absent     Pelvic: Cervical exam deferred        Extremities: Normal range of motion.  Edema: None  Mental Status: Normal mood and affect. Normal behavior. Normal judgment and thought content.   Assessment and Plan:  Pregnancy: G3P1102 at [redacted]w[redacted]d  1.  Supervision of high risk pregnancy in first trimester C/o itching all over body x 3 wks especially in bikini line, between breasts, scalp; pt visibly scratching all over body  Bathing 2x/day with Dove soap and using vagisil cream perianally and Aveeno lotion on body. Has 2 cats and 1 dog and treats animals for fleas with pills. No one else in house is scratching. Bile acids drawn today even though pt is not fasting. Counseling done: no soap while bathing, stop applying lotion, use tepid water to bathe, Aveeno bath, Benadryl at night, bathe no more than 1x/day Reviewed 08/18/21 u/s at 16 2/7 with anterior placenta, AFI wnl, EFW=23%, suboptimal anatomy, recommend cx length in 2 wks and growth in 4 wks Working 40 hrs/wk C/o varicose veins--has not gotten maternity support pantyhose yet--strongly urged to get and elevate legs prn, increase vit C - Bile acids, total  2. Urinary tract infection in mother during pregnancy, antepartum #1 GBS on 07/16/21 treated by A. White, FNP with Macrobid x7 days TOC on 08/14/21=GBS and treated with Keflex 500 mg TID x 7 on 08/19/21 by A. White, FNP TOC today  - Urine Culture - Urinalysis (Urine Dip)  3. Antepartum multigravida of advanced maternal age 57 yo Had genetic counseling apt 08/25/21 08/18/21 NIPS=neg  4. History of preterm delivery at 36 wks 10/16/2008 4#4 Monitor for PTL  5. Low birth weight infants x2 (5#8, 4#4) monitor  6. 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 H/O cesarean section 09/09/13 Wants TOLAC Needs [place] delivery plans apt at 32 wks  8. Epigastric pain Has  GI consult apt 10/13/21 after ER evaluation on 07/18/21 for epigastric pain. No meds now  9. Abnormal AFP3 test with 1:230 chance of OSB 08/18/21 Genetic counseling done 08/25/21. Has f/u detailed anatomy u/s 09/22/21 and repeat AFP only that day   Preterm labor symptoms and general obstetric precautions including but not limited to vaginal bleeding, contractions, leaking of fluid and fetal movement  were reviewed in detail with the patient. Please refer to After Visit Summary for other counseling recommendations.  Return in about 4 weeks (around 10/09/2021) for routine PNC.  Future Appointments  Date Time Provider Department Center  09/22/2021  2:00 PM [facility]    Alberteen Spindle, CNM

## 2021-09-13 LAB — URINE CULTURE

## 2021-09-13 LAB — BILE ACIDS, TOTAL: Bile Acids Total: 2.3 umol/L (ref 0.0–10.0)

## 2021-09-14 ENCOUNTER — Encounter: Payer: Self-pay | Admitting: Advanced Practice Midwife

## 2021-09-14 ENCOUNTER — Telehealth: Payer: Self-pay

## 2021-09-14 ENCOUNTER — Telehealth: Payer: Self-pay | Admitting: Family Medicine

## 2021-09-14 ENCOUNTER — Other Ambulatory Visit: Payer: Self-pay | Admitting: Advanced Practice Midwife

## 2021-09-14 DIAGNOSIS — O2340 Unspecified infection of urinary tract in pregnancy, unspecified trimester: Secondary | ICD-10-CM

## 2021-09-14 MED ORDER — AMOXICILLIN 250 MG PO CAPS: 250.0000 mg | ORAL_CAPSULE | Freq: Three times a day (TID) | ORAL | 0 refills | Status: DC

## 2021-09-14 NOTE — Telephone Encounter (Signed)
Telephone call is related to a conversation already started.  Will close this phone note and include it on already started note. Hart Carwin, RN

## 2021-09-14 NOTE — Telephone Encounter (Signed)
Telephone call to patient today regarding the need for UTI Tx. Need to inquire about Medicaid before scheduling an appointment.  If patient has Medicaid, Arnetha Courser, CNM will send script to her pharmacy.  If not, we need to schedule an appointment.  Left message for patient to return the call to the maternity nurses regarding antibiotics at [phone]. Hart Carwin, RN   Telephone call to patient Courtney Newman (Sister) [phone].  Left a message for her to have patient call the maternity nurses at [phone].  Language Line used today.  Greig Castilla / [phone].  Hart Carwin, RN

## 2021-09-14 NOTE — Telephone Encounter (Signed)
Per Gypsy Balsam, patient has regular Medicaid.  Chart routed to Arnetha Courser, Cm to have ABX sent to pharmacy.  Will notify patient when she returns the call. Hart Carwin, RN

## 2021-09-14 NOTE — Telephone Encounter (Signed)
Return call by patient.  She was at work earlier and could not accept the call. Hart Carwin, RN   Telephone call back to patient regarding the need for UTI Tx.  We need to know her pharmacy , any Medicaid or Insurance coverage so ABX can be called in or schedule her an appointment to come in for Tx this week.  Language Line used today.  Orlene Plum / [phone].  Hart Carwin, RN

## 2021-09-14 NOTE — Telephone Encounter (Signed)
Pt returning call from earlier today.  She was at work and wasn't able to receive the call.

## 2021-09-17 ENCOUNTER — Telehealth: Payer: Self-pay

## 2021-09-17 NOTE — Telephone Encounter (Signed)
Telephone call to back to patient this afternoon after she returned the call informing us of her pharmacy to let her know she had an ABX ready for pick up and if she could please pick that up today and start taking it.  Patient agrees to do so.  Language Line used today.  Darien Ramus / [phone].  Hart Carwin, RN

## 2021-09-17 NOTE — Telephone Encounter (Signed)
This telephone note was added to ongoing telephone note from Monday 09-14-21.  Will close this telephone note.  Hart Carwin, RN

## 2021-09-17 NOTE — Telephone Encounter (Signed)
Correction:  Daniella / [phone] was interpreter used during call. Hart Carwin, RN

## 2021-09-17 NOTE — Telephone Encounter (Signed)
Telephone call to patient to make sure she has picked up her ABX that was e-scribed to her pharmacy on 09-14-21.  Left a message for patient to return the call to let us know if she has picked up her ABX. Language Line used today.  Daniella / [phone].  Hart Carwin, RN

## 2021-09-17 NOTE — Telephone Encounter (Signed)
Telephone call to patient today regarding her ABX that was e-scribed to her pharmacy on 09-14-21.  Left message for patient to call and let us know if she picked up her ABX on Monday 09-14-21.  Language Line used today Kiel / [phone].  Hart Carwin, RN

## 2021-09-21 ENCOUNTER — Other Ambulatory Visit: Payer: Self-pay

## 2021-09-22 ENCOUNTER — Encounter: Payer: Self-pay | Admitting: Advanced Practice Midwife

## 2021-09-22 ENCOUNTER — Other Ambulatory Visit: Payer: Self-pay

## 2021-09-22 ENCOUNTER — Ambulatory Visit: Payer: Medicaid Other | Attending: Obstetrics and Gynecology

## 2021-09-22 ENCOUNTER — Ambulatory Visit (HOSPITAL_BASED_OUTPATIENT_CLINIC_OR_DEPARTMENT_OTHER): Payer: Medicaid Other | Admitting: Obstetrics and Gynecology

## 2021-09-22 DIAGNOSIS — Q379 Unspecified cleft palate with unilateral cleft lip: Secondary | ICD-10-CM | POA: Diagnosis not present

## 2021-09-22 DIAGNOSIS — Z3A21 21 weeks gestation of pregnancy: Secondary | ICD-10-CM

## 2021-09-22 DIAGNOSIS — Z148 Genetic carrier of other disease: Secondary | ICD-10-CM | POA: Insufficient documentation

## 2021-09-22 DIAGNOSIS — O09522 Supervision of elderly multigravida, second trimester: Secondary | ICD-10-CM | POA: Diagnosis not present

## 2021-09-22 DIAGNOSIS — O283 Abnormal ultrasonic finding on antenatal screening of mother: Secondary | ICD-10-CM | POA: Diagnosis not present

## 2021-09-22 DIAGNOSIS — Z363 Encounter for antenatal screening for malformations: Secondary | ICD-10-CM | POA: Insufficient documentation

## 2021-09-22 DIAGNOSIS — O35AXX0 Maternal care for other (suspected) fetal abnormality and damage, fetal facial anomalies, not applicable or unspecified: Secondary | ICD-10-CM

## 2021-09-22 DIAGNOSIS — O09292 Supervision of pregnancy with other poor reproductive or obstetric history, second trimester: Secondary | ICD-10-CM | POA: Insufficient documentation

## 2021-09-22 DIAGNOSIS — O09892 Supervision of other high risk pregnancies, second trimester: Secondary | ICD-10-CM

## 2021-09-22 DIAGNOSIS — O26892 Other specified pregnancy related conditions, second trimester: Secondary | ICD-10-CM | POA: Insufficient documentation

## 2021-09-22 DIAGNOSIS — O0991 Supervision of high risk pregnancy, unspecified, first trimester: Secondary | ICD-10-CM

## 2021-09-22 DIAGNOSIS — Q369 Cleft lip, unilateral: Secondary | ICD-10-CM | POA: Insufficient documentation

## 2021-09-22 DIAGNOSIS — O34219 Maternal care for unspecified type scar from previous cesarean delivery: Secondary | ICD-10-CM | POA: Diagnosis not present

## 2021-09-22 DIAGNOSIS — O09212 Supervision of pregnancy with history of pre-term labor, second trimester: Secondary | ICD-10-CM | POA: Diagnosis not present

## 2021-09-22 NOTE — Progress Notes (Signed)
Maternal-Fetal Medicine   Name: Tacoya Altizer DOB: 11-Sep-1985 MRN: [id number] Referring Provider: Arnetha Courser, CNM  I had the pleasure of seeing Ms. Mazzaferro today at [facility], [facility].  She is G3 P1102 at 21w 2d gestation and is here for fetal anatomy scan.  Her pregnancy was dated by 16-week ultrasound performed at our office.  On cell-free fetal DNA screen, the risks of fetal aneuploidies are not increased.  MSAFP screening showed increased risk for open neural tube defect (1 in 26; 2.62 MoM).  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  Obstetrical history significant for a spontaneous preterm vaginal delivery at [redacted] weeks gestation in 2010 of a female infant weighing 1,928 g at birth.  In 2015, she had a term cesarean delivery of a female infant weighing 2,495 g at birth.  Patient did not take prophylactic progesterone injections in that pregnancy.  Past medical history: No history of hypertension or diabetes or any chronic medical conditions.  She had recent urinary tract infection and is on amoxicillin treatment. Past surgical history: Cesarean section, abdominoplasty (2017).  Medications: Prenatal vitamins, amoxicillin. Allergies: No known drug allergies. Social history: Denies tobacco or drug or alcohol use. Ultrasound We performed a fetal anatomical survey.  Amniotic fluid is normal and good fetal activity seen.  Fetal biometry is consistent with the previously established dates.   -A large right-sided cleft lip is seen.  -Alveolar ridge was incomplete, which strongly raises the possibility of associated cleft palate.  3D images could not be obtained because of fetal position. -Intracranial structures and fetal chin appear normal. -Cardiac anatomy appears normal. -Rest of the fetal anatomy appears normal. On transabdominal scan, the cervix looks long and closed.  Cleft lip and palate: -I explained the findings with  help of ultrasound (real-time and still images). -Unilateral cleft lip is present in about 75% of cases and cleft palate is present in about 75% of these cases.  -About 2% to 7% of facial clefts are associated with genetic syndrome.  -About 25% (15% to 40%) of cases have associated anomalies.  -I reassured the patient of absence of other anomalies. However, I informed her that ultrasound has limitations in detecting anomalies that could be missed.  -Although cardiac anatomy appears normal, I recommended fetal echocardiography by pediatric cardiologists. We will set up an appointment.  -I discussed amniocentesis for fetal karyotype and some (not all) genetic syndromes that could be identified by microarray. I discussed the procedure and possible complications including miscarriage (1 in 500 procedures). I discussed cell-free fetal DNA screening and its limitations.  -Patient with her genetic counselor and opted to have amniocentesis.  We could not perform amniocentesis today because of scheduling conflicts.  Patient informed that she can come for amnio next week at our [facility] office.  -Patient will be referred to cleft team later in pregnancy (or earlier at her request) to discuss postnatal surgery.   -I briefly discussed neonatal management. Surgery is usually performed before 3 months in isolated cleft lip and in 6 months to a year if cleft palate is confirmed.  -Patient informed that she will continue her pregnancy regardless of amniocentesis results.  Previous cesarean delivery -I counseled her that she can safely attempt vaginal delivery (VBAC) and the risk of scar rupture is about 1%.  Recommendations -An appointment was made for [facility]ocentesis to be [facility] on 10/02/2021. -Transvaginal ultrasound to evaluate the cervix at  that visit.  3D images to be attempted. -Amniotic fluid to be sent for fetal karyotype, MicroArray and SMA  carrier status. -We have requested an appointment for fetal echocardiography ([facility]). -Fetal growth assessments every 4 weeks. -Appointment with cleft team in the third trimester.  Thank you for consultation.  If you have any questions or concerns, please contact me the [facility].  Consultation including face-to-face counseling (more than 50% of time spent) is 45 minutes.

## 2021-09-23 ENCOUNTER — Other Ambulatory Visit: Payer: Self-pay | Admitting: *Deleted

## 2021-09-23 ENCOUNTER — Telehealth: Payer: Self-pay | Admitting: Family Medicine

## 2021-09-23 DIAGNOSIS — O09899 Supervision of other high risk pregnancies, unspecified trimester: Secondary | ICD-10-CM

## 2021-09-23 DIAGNOSIS — O35AXX0 Maternal care for other (suspected) fetal abnormality and damage, fetal facial anomalies, not applicable or unspecified: Secondary | ICD-10-CM

## 2021-09-23 NOTE — Telephone Encounter (Signed)
Returned call to patient who states she did pick up her UTI medication. She states she thinks she picked up her medicine and started taking it on 09/14/21. Patient states she has been taking 1 capsule three times per day. When asked how many capsules she has left, she states "a lot". Patient asked to count the remaining capsules and states she has 20. Patient asked to look at the bottle of medication and states it is Amoxicillin 250mg  with 30 capsules. Patient counseled that she should not still have 20 capsules if she started on 09/14/21 and has been taking it three times per day. Patient states that maybe she didn't start on 09/14/21. Consulted with provider, A. White, FNP and patient scheduled for OB problem visit tomorrow afternoon and counseled to continue taking her medication 3x/day. Patient states understanding and also states that in addition to right sided pain, burning, itching and irritation, her right foot hurts when she sleeps. Patient counseled that she can discuss that with provider tomorrow.11/15/21, RN

## 2021-09-23 NOTE — Telephone Encounter (Signed)
Pt calls & states she is having pain in abdomen?/pain when urinates, burning & itching. Is 5 months pregnant. Please advise.

## 2021-09-24 ENCOUNTER — Encounter: Payer: Self-pay | Admitting: Physician Assistant

## 2021-09-24 ENCOUNTER — Ambulatory Visit: Payer: Medicaid Other | Admitting: Physician Assistant

## 2021-09-24 VITALS — BP 107/70 | HR 83 | Temp 98.0°F | Wt 137.2 lb

## 2021-09-24 DIAGNOSIS — O0992 Supervision of high risk pregnancy, unspecified, second trimester: Secondary | ICD-10-CM

## 2021-09-24 DIAGNOSIS — O0991 Supervision of high risk pregnancy, unspecified, first trimester: Secondary | ICD-10-CM

## 2021-09-24 DIAGNOSIS — B379 Candidiasis, unspecified: Secondary | ICD-10-CM

## 2021-09-24 DIAGNOSIS — O2342 Unspecified infection of urinary tract in pregnancy, second trimester: Secondary | ICD-10-CM

## 2021-09-24 DIAGNOSIS — O2340 Unspecified infection of urinary tract in pregnancy, unspecified trimester: Secondary | ICD-10-CM

## 2021-09-24 LAB — WET PREP FOR TRICH, YEAST, CLUE: Trichomonas Exam: NEGATIVE

## 2021-09-24 MED ORDER — CLOTRIMAZOLE 1 % VA CREA: 1.0000 | TOPICAL_CREAM | Freq: Every day | VAGINAL | 0 refills | Status: AC

## 2021-09-24 NOTE — Progress Notes (Signed)
Patient here for OB problem, states having pain on right side abdomen and around to back and down right leg, especially when lying down. C/O burning with urination and itching. Currently taking Amoxicillin for UTI, see phone note from yesterday.Burt Knack, RN

## 2021-09-24 NOTE — Progress Notes (Signed)
[facility]  PRENATAL VISIT NOTE  Subjective:  Courtney Newman is a 36 y.o. G3P1102 at [redacted]w[redacted]d being seen today for ongoing prenatal care.  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  Patient reports  vaginal itching and irritation, right hip pain that radiates to front of right leg, back pain. No fever, vomiting, N/V. Started amoxicillin three times daily for UTI on 09/20/21 (was supposed to start on 7/10) .  Contractions: Not present. Vag. Bleeding: None.  Movement: Present. Denies leaking of fluid/ROM.   The following portions of the patient's history were reviewed and updated as appropriate: allergies, current medications, past family history, past medical history, past social history, past surgical history and problem list. Problem list updated.  Objective:   Vitals:   09/24/21 1523  BP: 107/70  Pulse: 83  Temp: 98 F (36.7 C)  Weight: 137 lb 3.2 oz (62.2 kg)    Fetal Status: Fetal Heart Rate (bpm): 148 Fundal Height: 21 cm Movement: Present     General:  Alert, oriented and cooperative. Patient is in no acute distress.  Skin: Skin is warm and dry. No rash noted.   Cardiovascular: Normal heart rate noted  Respiratory: Normal respiratory effort, no problems with respiration noted  Abdomen: Soft, gravid, appropriate for  gestational age.  Pain/Pressure: Absent     Pelvic: Cervical exam deferred      External genitalia WNL. Vaginal exam with red mucosa, thick white clumpy discharge with pH 4.0.  Extremities: Normal range of motion.  Edema: None  Mental Status: Normal mood and affect. Normal behavior. Normal judgment and thought content.   Assessment and Plan:  Pregnancy: G3P1102 at [redacted]w[redacted]d  1. Supervision of high risk pregnancy in first trimester Exam and wet prep c/w vaginal yeast infection. Treat per S.O. - WET PREP FOR TRICH, YEAST, CLUE  2. UTI #3 09/11/21 GBS Enc pt to complete antibiotic, take all pills, even if feeling well. Plan TOC at 10/09/21 RV (already scheduled.) Will need UTI prophylaxis upon clearance.   Preterm labor symptoms and general obstetric precautions including but not limited to vaginal bleeding, contractions, leaking of fluid and fetal movement were reviewed in detail with the patient. Due to language barrier, an interpreter Conservation officer, historic buildings [phone]) was present during the history-taking and subsequent discussion (and for part of the physical exam) with this patient.  Please refer to After Visit Summary for other counseling recommendations.    Future Appointments  Date Time Provider Department Center  10/02/2021 12:30 PM [facility] NURSE [facility] [place]  10/02/2021 12:45 PM [facility] US5 [facility]  10/09/2021  4:00 PM [facility] PROVIDER [facility] None  10/22/2021  4:00 PM [facility]    Landry Dyke, PA-C

## 2021-09-24 NOTE — Progress Notes (Signed)
Patient treated for yeast per SO. Patient aware of upcoming appointments.Marland KitchenMarland KitchenBurt Knack, RN

## 2021-10-02 ENCOUNTER — Other Ambulatory Visit: Payer: Medicaid Other

## 2021-10-02 ENCOUNTER — Ambulatory Visit: Payer: Medicaid Other

## 2021-10-06 ENCOUNTER — Other Ambulatory Visit: Payer: Self-pay

## 2021-10-06 ENCOUNTER — Telehealth: Payer: Self-pay | Admitting: Obstetrics and Gynecology

## 2021-10-06 NOTE — Telephone Encounter (Signed)
I called Courtney Newman with the aid of a Spanish interpreter.  She missed an appointment last week in our [place] office for an amniocentesis and has another visit in our [place] location scheduled for 10/22/21 at a time when our physician will be remote and therefore not available to perform amniocentesis.  During our conversation, the patient stated that she did not attend the [facility] appointment because she had trouble getting off work and because she decided that she does not wish to have amniocentesis.  We again reviewed that the option of invasive prenatal diagnosis is available but is completely her decision and she can decline or elect to have it at a future appointment if she were to change her mind.  Because the attending MFM will not be on site at the time of her 8/17 visit, we offered her the option to move her appointment to a different date.  Given her work schedule, she could only come in the afternoon. Our next available afternoon appointment was given to her.  She is scheduled for 11/05/21 at 2:00pm for a follow up ultrasound at [facility].  We can be reached at [phone] with any questions or concerns.  [name], MS, CGC

## 2021-10-07 NOTE — Telephone Encounter (Signed)
No additional note

## 2021-10-09 ENCOUNTER — Ambulatory Visit: Payer: Medicaid Other | Admitting: Advanced Practice Midwife

## 2021-10-09 VITALS — BP 100/69 | HR 93 | Temp 97.9°F | Wt 142.4 lb

## 2021-10-09 DIAGNOSIS — R6252 Short stature (child): Secondary | ICD-10-CM

## 2021-10-09 DIAGNOSIS — Z98891 History of uterine scar from previous surgery: Secondary | ICD-10-CM

## 2021-10-09 DIAGNOSIS — O0991 Supervision of high risk pregnancy, unspecified, first trimester: Secondary | ICD-10-CM

## 2021-10-09 DIAGNOSIS — Q369 Cleft lip, unilateral: Secondary | ICD-10-CM

## 2021-10-09 DIAGNOSIS — O2340 Unspecified infection of urinary tract in pregnancy, unspecified trimester: Secondary | ICD-10-CM

## 2021-10-09 DIAGNOSIS — Z8751 Personal history of pre-term labor: Secondary | ICD-10-CM

## 2021-10-09 DIAGNOSIS — O09529 Supervision of elderly multigravida, unspecified trimester: Secondary | ICD-10-CM

## 2021-10-09 DIAGNOSIS — B379 Candidiasis, unspecified: Secondary | ICD-10-CM

## 2021-10-09 MED ORDER — CLOTRIMAZOLE 1 % VA CREA: 1.0000 | TOPICAL_CREAM | Freq: Every day | VAGINAL | 0 refills | Status: AC

## 2021-10-09 NOTE — Progress Notes (Signed)
[facility]  PRENATAL VISIT NOTE  Subjective:  Courtney Newman is a 36 y.o. G3P1102 at [redacted]w[redacted]d being seen today for ongoing prenatal care.  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  Patient reports  internal vaginal itching returned, fatigue with working .  Contractions: Not present.  .  Movement: Present. Denies leaking of fluid/ROM.   The following portions of the patient's history were reviewed and updated as appropriate: allergies, current medications, past family history, past medical history, past social history, past surgical history and problem list. Problem list updated.  Objective:   Vitals:   10/09/21 1555  BP: 100/69  Pulse: 93  Temp: 97.9 F (36.6 C)  Weight: 142 lb 6.4 oz (64.6 kg)    Fetal Status: Fetal Heart Rate (bpm): 145 Fundal Height: 23 cm Movement: Present     General:  Alert, oriented and cooperative. Patient is in no acute distress.  Skin: Skin is warm and dry. No rash noted.   Cardiovascular: Normal heart rate noted  Respiratory: Normal respiratory effort, no problems with respiration noted  Abdomen: Soft, gravid, appropriate for gestational age.  Pain/Pressure: Absent     Pelvic: Cervical exam deferred        Extremities: Normal range of motion.  Edema: None  Mental Status: Normal mood and  affect. Normal behavior. Normal judgment and thought content.   Assessment and Plan:  Pregnancy: G3P1102 at [redacted]w[redacted]d  1. Urinary tract infection in mother during pregnancy, antepartum E-rx'd antibiotic 09/14/21 and pt picked up med 09/17/21 C&S TOC today - Urine Culture  2. Antepartum multigravida of advanced maternal age 58 yo Genetic counseling done 08/25/21 with NIPS=neg Amnio 10/02/21   3. Supervision of high risk pregnancy in first trimester 7 lb 6.4 oz (3.357 kg) 5 lb wt gain in past 2 wks Has GI consult on 10/13/21 for ER epigastric pain Not exercising Working 51 hrs/wk and tired--note written to only work 40 hrs/wk and no more than 8 hours/day States no more body itching now that stopped using coconut oil lotion; bile acids on 09/11/21=2.3 Reviewed 09/22/21 u/s at 20 6/7 with AFI wnl, EFW=13%, 3VC, large right cleft lip, unable to assess palate with 3D due to fetal position F/u u/s 11/05/21  - WET PREP FOR TRICH, YEAST, CLUE  4. History of preterm delivery at 35 wks 10/16/2008 4#4 monitor  5. H/O cesarean section 09/09/13 Wants TOLAC  Needs delivery plans apt with KC at 32 wks   7. large right cleft lip with possible palate involvement on 09/22/21 u/s Discovered on 09/22/21 u/s Recommended fetal ECHO which MFM will order  8. 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 Please refer to After Visit Summary for other counseling recommendations.  Return  in about 4 weeks (around 11/06/2021) for routine PNC.  Future Appointments  Date Time Provider Department Center  11/05/2021  2:00 PM [facility] US1 [facility]  11/05/2021  3:00 PM [facility] CONSULT RM [facility] None    Alberteen Spindle, CNM

## 2021-10-09 NOTE — Progress Notes (Signed)
Patient here for MH RV at 23 5/7. PHQ9 and CMHRP today. S/S PTL reviewed and literature given. Needs urine TOC today, states she has finished her medication. Patient treated for yeast at last appointment on 09/24/21 and states she is still having itching. Also states she is being required to work about 50 hours a week and is very tired. Burt Knack, RN

## 2021-10-09 NOTE — Progress Notes (Signed)
Wet prep reviewed, patient treated for yeast per SO. BTL consent signed today, copy to patient, copy to R. Marlan Palau and copy sent for scanning. Patient aware of upcoming appointments and U/S. Patient counseled on 28 week labs at next visit.Burt Knack, RN

## 2021-10-11 LAB — URINE CULTURE

## 2021-10-12 LAB — WET PREP FOR TRICH, YEAST, CLUE: Trichomonas Exam: NEGATIVE

## 2021-10-22 ENCOUNTER — Ambulatory Visit: Payer: Medicaid Other

## 2021-11-03 ENCOUNTER — Other Ambulatory Visit: Payer: Self-pay

## 2021-11-03 DIAGNOSIS — O09522 Supervision of elderly multigravida, second trimester: Secondary | ICD-10-CM

## 2021-11-03 DIAGNOSIS — O34219 Maternal care for unspecified type scar from previous cesarean delivery: Secondary | ICD-10-CM

## 2021-11-03 DIAGNOSIS — O35AXX0 Maternal care for other (suspected) fetal abnormality and damage, fetal facial anomalies, not applicable or unspecified: Secondary | ICD-10-CM

## 2021-11-03 DIAGNOSIS — O09892 Supervision of other high risk pregnancies, second trimester: Secondary | ICD-10-CM

## 2021-11-04 ENCOUNTER — Encounter: Payer: Self-pay | Admitting: Obstetrics and Gynecology

## 2021-11-04 ENCOUNTER — Other Ambulatory Visit: Payer: Self-pay

## 2021-11-04 ENCOUNTER — Observation Stay
Admission: EM | Admit: 2021-11-04 | Discharge: 2021-11-04 | Disposition: A | Discharge status: Home / Self Care | Payer: Medicaid Other | Attending: Advanced Practice Midwife | Admitting: Advanced Practice Midwife

## 2021-11-04 DIAGNOSIS — Z79899 Other long term (current) drug therapy: Secondary | ICD-10-CM | POA: Insufficient documentation

## 2021-11-04 DIAGNOSIS — Z3A27 27 weeks gestation of pregnancy: Secondary | ICD-10-CM

## 2021-11-04 DIAGNOSIS — O99512 Diseases of the respiratory system complicating pregnancy, second trimester: Principal | ICD-10-CM

## 2021-11-04 DIAGNOSIS — J019 Acute sinusitis, unspecified: Secondary | ICD-10-CM | POA: Diagnosis not present

## 2021-11-04 DIAGNOSIS — J018 Other acute sinusitis: Secondary | ICD-10-CM

## 2021-11-04 DIAGNOSIS — R519 Headache, unspecified: Secondary | ICD-10-CM | POA: Insufficient documentation

## 2021-11-04 DIAGNOSIS — O26892 Other specified pregnancy related conditions, second trimester: Secondary | ICD-10-CM | POA: Diagnosis present

## 2021-11-04 LAB — URINALYSIS, COMPLETE (UACMP) WITH MICROSCOPIC
Bilirubin Urine: NEGATIVE
Glucose, UA: NEGATIVE mg/dL
Hgb urine dipstick: NEGATIVE
Ketones, ur: NEGATIVE mg/dL
Nitrite: NEGATIVE
Protein, ur: NEGATIVE mg/dL
Specific Gravity, Urine: 1.008 (ref 1.005–1.030)
pH: 6 (ref 5.0–8.0)

## 2021-11-04 NOTE — OB Triage Note (Signed)
Pt is G3P2 at 27.3w with c/o headache and nasal congestion x 3 days with vomiting today. Her reason for coming this evening is intractable headache. She took tylenol without relief. Pt denies any OB complaints this evening. + FM  FHR 155

## 2021-11-04 NOTE — Discharge Instructions (Addendum)
Safe medications:  Sudafed: pseudoephedrine for congestion Mucinex to thin mucous Robitussin for cough Throat lozenges for sore throat Benadryl for allergy symptoms/sleep Tylenol for pain/fever

## 2021-11-04 NOTE — Discharge Summary (Cosign Needed Addendum)
Physician Final Progress Note  Patient ID: Courtney Newman MRN: [id number] DOB/AGE: 37-11-1985 37 y.o.  Admit date: 11/04/2021 Admitting provider: Tresea Mall, CNM Discharge date: 11/04/2021   Admission Diagnoses:  1) intrauterine pregnancy at [redacted]w[redacted]d  2) sinus congestion 3) body aches 4) fever  Discharge Diagnoses:  Principal Problem:   Acute sinus infection Active Problems:   [redacted] weeks gestation of pregnancy    History of Present Illness: The patient is a 36 y.o. female [id number] at [redacted]w[redacted]d who presents for headache due to sinus congestion. She has had a fever and body aches. She also mentions some burning with urination. She reports good fetal movement. She denies contractions, leaking fluid or vaginal bleeding. She was admitted for observation and placed on monitors, labs collected.  Monitoring is reassuring. We discussed likely cold/upper respiratory symptoms, comfort measures and safe medications. She is discharged to home with instructions and precautions.   Past Medical History:  Diagnosis Date   Ear infection 06/2021   tx with Amoxicillin   Preterm labor     Past Surgical History:  Procedure Laterality Date   CESAREAN SECTION  09/09/2013   PLACEMENT OF BREAST IMPLANTS Bilateral 2017    No current facility-administered medications on file prior to encounter.   Current Outpatient Medications on File Prior to Encounter  Medication Sig Dispense Refill   acetaminophen (TYLENOL) 325 MG tablet Take 325 mg by mouth every 6 (six) hours as needed. PRN (Patient not taking: Reported on 09/24/2021)     amoxicillin (AMOXIL) 250 MG capsule Take 1 capsule (250 mg total) by mouth 3 (three) times daily. (Patient not taking: Reported on 10/09/2021) 30 capsule 0   Prenatal Vit-Fe Fumarate-FA (PRENATAL MULTIVITAMIN) TABS tablet Take 1 tablet by mouth daily at 12 noon.      No Known Allergies  Social History   Socioeconomic History   Marital status: Legally Separated    Spouse name:  Jose   Number of children: 2   Years of education: 6   Highest education level: 6th grade  Occupational History   Occupation: packing  Tobacco Use   Smoking status: Never    Passive exposure: Never   Smokeless tobacco: Never  Vaping Use   Vaping Use: Never used  Substance and Sexual Activity   Alcohol use: Not Currently    Comment: occ, last use 03/01/21   Drug use: Never   Sexual activity: Yes    Birth control/protection: Injection, Condom    Comment: current use of condom, hx depo 2021  Other Topics Concern   Not on file  Social History Narrative   Not on file   Social Determinants of Health   Financial Resource Strain: Medium Risk (07/16/2021)   Overall Financial Resource Strain (CARDIA)    Difficulty of Paying Living Expenses: Somewhat hard  Food Insecurity: No Food Insecurity (07/16/2021)   Hunger Vital Sign    Worried About Running Out of Food in the Last Year: Never true    Ran Out of Food in the Last Year: Never true  Transportation Needs: No Transportation Needs (07/16/2021)   PRAPARE - Administrator, Civil Service (Medical): No    Lack of Transportation (Non-Medical): No  Physical Activity: Not on file  Stress: Not on file  Social Connections: Not on file  Intimate Partner Violence: Not At Risk (07/16/2021)   Humiliation, Afraid, Rape, and Kick questionnaire    Fear of Current or Ex-Partner: No    Emotionally Abused: No    Physically Abused:  No    Sexually Abused: No    Family History  Problem Relation Age of Onset   Diabetes Mother    Healthy Father      Review of Systems  Constitutional:  Positive for fever. Negative for chills.  HENT:  Positive for congestion and sinus pain. Negative for ear discharge, ear pain, hearing loss and sore throat.   Eyes:  Negative for blurred vision and double vision.  Respiratory:  Negative for cough, shortness of breath and wheezing.   Cardiovascular:  Negative for chest pain, palpitations and leg swelling.   Gastrointestinal:  Negative for abdominal pain, blood in stool, constipation, diarrhea, heartburn, melena, nausea and vomiting.  Genitourinary:  Positive for dysuria. Negative for flank pain, frequency, hematuria and urgency.  Musculoskeletal:  Negative for back pain, joint pain and myalgias.       Positive for body aches  Skin:  Negative for itching and rash.  Neurological:  Positive for headaches. Negative for dizziness, tingling, tremors, sensory change, speech change, focal weakness, seizures, loss of consciousness and weakness.  Endo/Heme/Allergies:  Negative for environmental allergies. Does not bruise/bleed easily.  Psychiatric/Behavioral:  Negative for depression, hallucinations, memory loss, substance abuse and suicidal ideas. The patient is not nervous/anxious and does not have insomnia.      Physical Exam: Temp 99.9 F (37.7 C) (Oral)   Resp 16   LMP 04/17/2021 (Exact Date) Comment: light menses lasting 2 days  Constitutional: Well nourished, well developed female in mild acute distress.  HEENT: orbital sinus pressure/pain on palpation Skin: Warm and dry.  Cardiovascular: Regular rate and rhythm.   Extremity:  no edema   Respiratory: Clear to auscultation bilateral. Normal respiratory effort Abdomen: FHT present Back: no CVAT Psych: Alert and Oriented x3. No memory deficits. Normal mood and affect.   Toco: negative Fetal well being: reactive NST, 150 bpm, moderate variability, +accelerations, -decelerations  Consults: None  Significant Findings/ Diagnostic Studies: labs:  UA and culture pending  Procedures: NST  Hospital Course: The patient was admitted to Labor and Delivery Triage for observation.   Discharge Condition: good  Disposition: Discharge disposition: 01-Home or Self Care  Diet: Regular diet, stay hydrated  Discharge Activity: Activity as tolerated, rest as needed  Discharge Instructions     Discharge activity:   Complete by: As directed     Rest as needed   Discharge diet:   Complete by: As directed    Stay hydrated, take over the counter medications to treat symptoms. Refer to safe medication list.      Allergies as of 11/04/2021   No Known Allergies      Medication List     STOP taking these medications    acetaminophen 325 MG tablet Commonly known as: TYLENOL   amoxicillin 250 MG capsule Commonly known as: AMOXIL       TAKE these medications    prenatal multivitamin Tabs tablet Take 1 tablet by mouth daily at 12 noon.        Follow-up Information     [facility] DEPT. Go to.   Why: scheduled appointment Contact information: [address] [place] [address] [phone]              Work note given: may return to work 11/06/2021  Total time spent taking care of this patient: 23 minutes  Signed: Tresea Mall, CNM  11/04/2021, 10:17 PM

## 2021-11-05 ENCOUNTER — Ambulatory Visit: Payer: Medicaid Other

## 2021-11-05 ENCOUNTER — Other Ambulatory Visit: Payer: Self-pay

## 2021-11-05 ENCOUNTER — Ambulatory Visit: Payer: Medicaid Other | Attending: Maternal & Fetal Medicine

## 2021-11-05 ENCOUNTER — Institutional Professional Consult (permissible substitution): Payer: Medicaid Other

## 2021-11-05 NOTE — Progress Notes (Deleted)
Pt arrived to the MFM clinic with Headache, Fever, Chills, Sinus Pressure today for her MFM appt.  Dr. Grace Bushy notified and Katrina Stack also spoke with pt about Genetic Testing.  Encouraged pt to get tested for Covid and we will reschedule her  Ultrasound appt.  Annice Pih Interpreter utilized for visit.

## 2021-11-05 NOTE — Progress Notes (Signed)
I spoke briefly with Courtney Newman and her partner about the SMA carrier screening results and option for him to have testing with the aid of a Spanish interpreter. All questions were answered and her partner declined to have screening.  The patient was not able to have the ultrasound today because she presented to clinic with COVID symptoms but was rescheduled for our next available appointment (9/26). She again declined amniocentesis.  [name], MS, CGC

## 2021-11-05 NOTE — Progress Notes (Deleted)
Please excuse Courtney Newman from work today due to an appointment @ [facility].  11/05/21 Thank you, Roxy Horseman, RN [phone]

## 2021-11-05 NOTE — Progress Notes (Deleted)
Please excuse Courtney Newman from work today due to an appointment @ [facility].  11/05/21 Thank you, Lawrnce Reyez, RN [phone] 

## 2021-11-06 ENCOUNTER — Ambulatory Visit: Payer: Medicaid Other

## 2021-11-06 LAB — URINE CULTURE

## 2021-11-13 ENCOUNTER — Ambulatory Visit: Payer: Medicaid Other | Admitting: Nurse Practitioner

## 2021-11-13 VITALS — BP 93/60 | HR 93 | Temp 97.5°F | Wt 145.6 lb

## 2021-11-13 DIAGNOSIS — R772 Abnormality of alphafetoprotein: Secondary | ICD-10-CM

## 2021-11-13 DIAGNOSIS — R1013 Epigastric pain: Secondary | ICD-10-CM

## 2021-11-13 DIAGNOSIS — O0991 Supervision of high risk pregnancy, unspecified, first trimester: Secondary | ICD-10-CM

## 2021-11-13 DIAGNOSIS — Q369 Cleft lip, unilateral: Secondary | ICD-10-CM

## 2021-11-13 DIAGNOSIS — Z23 Encounter for immunization: Secondary | ICD-10-CM

## 2021-11-13 DIAGNOSIS — O0993 Supervision of high risk pregnancy, unspecified, third trimester: Secondary | ICD-10-CM

## 2021-11-13 LAB — HEMOGLOBIN, FINGERSTICK: Hemoglobin: 11.8 g/dL (ref 11.1–15.9)

## 2021-11-13 NOTE — Progress Notes (Unsigned)
TOLAC referral (along with c/s pre-op notes) faxed to [facility] with confirmation.   Earlyne Iba, RN

## 2021-11-13 NOTE — Progress Notes (Unsigned)
Hgb reviewed during clinic visit - no treatment indicated.   Makye Radle Ramos, RN  

## 2021-11-13 NOTE — Progress Notes (Signed)
Patient here for MH RV at [redacted]w[redacted]d.   28 week labs completed today along with Tdap administration.   Patient states she went to labor and delivery triage on 11/04/21 for cold-like sx. Patient was discharged home with instructions to hydrate and rest. On 11/05/21 she had her [place] scheduled along with genetic counseling. That appt was cancelled due to having cold-like symptoms and was told to take covid test at home. On 11/05/21 she took a covid test and It was positive. Patient states she feels much better today. [place] has been rescheduled for 12/01/21 at 2pm.   Patient states she also had a fetal echocardiogram on 11/03/21 at [facility]. She states this echo was programed by provider she saw during 09/22/21 [place]. Provider made aware.   Earlyne Iba

## 2021-11-13 NOTE — Progress Notes (Signed)
[facility]  PRENATAL VISIT NOTE  Subjective:  Courtney Newman is a 36 y.o. G3P1102 at [redacted]w[redacted]d being seen today for ongoing prenatal care.  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  Patient reports no complaints.  Contractions: Not present. Vag. Bleeding: None.  Movement: Present. Denies leaking of fluid/ROM.   The following portions of the patient's history were reviewed and updated as appropriate: allergies, current medications, past family history, past medical history, past social history, past surgical history and problem list. Problem list updated.  Objective:   Vitals:   11/13/21 1529  BP: 93/60  Pulse: 93  Temp: (!) 97.5 F (36.4 C)  Weight: 145 lb 9.6 oz (66 kg)    Fetal Status: Fetal Heart Rate (bpm): 140 Fundal Height: 29 cm Movement: Present     General:  Alert, oriented and cooperative. Patient is in no acute distress.  Skin: Skin is warm and dry. No rash noted.   Cardiovascular: Normal heart rate noted  Respiratory: Normal respiratory effort, no problems with respiration noted  Abdomen: Soft, gravid, appropriate for gestational age.  Pain/Pressure: Absent     Pelvic: Cervical exam deferred        Extremities: Normal range of motion.  Edema: None   Mental Status: Normal mood and affect. Normal behavior. Normal judgment and thought content.   Assessment and Plan:  Pregnancy: G3P1102 at [redacted]w[redacted]d  1. Supervision of high risk pregnancy in first trimester -36 year old female in clinic today for prenatal care. -No complaints today.   -Patient taking PNV daily. -28 week labs today. -Tdap given. -Hemoglobin= 11.8 -10 lb 9.6 oz (4.808 kg)  -TOLAC referral submitted   - Hemoglobin, fingerstick - Glucose, 1 hour gestational - HIV-1/HIV-2 Qualitative RNA - RPR - Tdap vaccine greater than or equal to 7yo IM  2. large right cleft lip with possible palate involvement on 09/22/21 u/s -Cleft lip palate noted on 09/22/21 U/S.  Recommendations from U/S: - amniocentesis on 10/02/2021.  -Transvaginal ultrasound to evaluate the cervix   -Amniotic fluid to be sent for fetal karyotype, MicroArray and  SMA carrier status.  -Fetal echocardiography=Done on 11/03/21, fetal heart appears normal no additional follow up needed at this time.   -Fetal growth assessments every 4 weeks.  -Appointment with cleft team in the third trimester  3. Epigastric pain -Patient continues to have epigastric discomfort.  Patient takes Pepcid to manage the symptoms.  Was prescribed lansoprazole, but has not been taking medication.  Continue with Pepcid and reframe from consuming spicy foods and restrictive clothing.      Term labor symptoms and general obstetric precautions including but not limited to vaginal bleeding, contractions, leaking of fluid and fetal movement were reviewed in detail with the patient. Please refer to After Visit Summary for other counseling recommendations.   Return in about 2  weeks (around 11/27/2021) for Routine prenatal care visit.  Future Appointments  Date Time Provider Department Center  11/27/2021  4:00 PM [facility] PROVIDER [facility] None  12/01/2021  2:00 PM [facility]    Glenna Fellows, FNP

## 2021-11-15 ENCOUNTER — Encounter: Payer: Self-pay | Admitting: Nurse Practitioner

## 2021-11-16 LAB — RPR: RPR Ser Ql: NONREACTIVE

## 2021-11-16 LAB — HIV-1/HIV-2 QUALITATIVE RNA
HIV-1 RNA, Qualitative: NONREACTIVE
HIV-2 RNA, Qualitative: NONREACTIVE

## 2021-11-16 LAB — GLUCOSE, 1 HOUR GESTATIONAL: Gestational Diabetes Screen: 107 mg/dL (ref 70–139)

## 2021-11-18 ENCOUNTER — Telehealth: Payer: Self-pay

## 2021-11-18 NOTE — Telephone Encounter (Signed)
[facility] called clinic asking if we can send patient's snapshot with problem list due to upcoming [place] 09/22/21.   Snapshot faxed to [facility] with confirmation.   Earlyne Iba, RN

## 2021-11-20 ENCOUNTER — Telehealth: Payer: Self-pay

## 2021-11-20 NOTE — Telephone Encounter (Signed)
Call to [facility] to ascertain if client has been scheduled a TOLAC consult appt as referral faxed 11/13/21 with confirmation. Per Mitzi, appt has been scheduled for 12/02/2021 at 1500. Jossie Ng, RN

## 2021-11-26 ENCOUNTER — Other Ambulatory Visit: Payer: Self-pay

## 2021-11-26 DIAGNOSIS — O34219 Maternal care for unspecified type scar from previous cesarean delivery: Secondary | ICD-10-CM

## 2021-11-26 DIAGNOSIS — O35AXX0 Maternal care for other (suspected) fetal abnormality and damage, fetal facial anomalies, not applicable or unspecified: Secondary | ICD-10-CM

## 2021-11-26 DIAGNOSIS — O09522 Supervision of elderly multigravida, second trimester: Secondary | ICD-10-CM

## 2021-11-26 NOTE — Addendum Note (Signed)
Addended by: Cletis Media on: 11/26/2021 09:44 AM   Modules accepted: Orders

## 2021-11-27 ENCOUNTER — Ambulatory Visit: Payer: Medicaid Other | Admitting: Advanced Practice Midwife

## 2021-11-27 VITALS — BP 100/63 | HR 86 | Temp 97.4°F | Wt 148.4 lb

## 2021-11-27 DIAGNOSIS — Z98891 History of uterine scar from previous surgery: Secondary | ICD-10-CM

## 2021-11-27 DIAGNOSIS — Z8751 Personal history of pre-term labor: Secondary | ICD-10-CM

## 2021-11-27 DIAGNOSIS — Q369 Cleft lip, unilateral: Secondary | ICD-10-CM

## 2021-11-27 DIAGNOSIS — Z23 Encounter for immunization: Secondary | ICD-10-CM

## 2021-11-27 DIAGNOSIS — O0993 Supervision of high risk pregnancy, unspecified, third trimester: Secondary | ICD-10-CM

## 2021-11-27 DIAGNOSIS — O0991 Supervision of high risk pregnancy, unspecified, first trimester: Secondary | ICD-10-CM

## 2021-11-27 DIAGNOSIS — O09529 Supervision of elderly multigravida, unspecified trimester: Secondary | ICD-10-CM

## 2021-11-27 DIAGNOSIS — R1013 Epigastric pain: Secondary | ICD-10-CM

## 2021-11-27 NOTE — Progress Notes (Signed)
[place] Department [facility]  PRENATAL VISIT NOTE  Subjective:  Courtney Newman is a 36 y.o. G3P1102 at [redacted]w[redacted]d being seen today for ongoing prenatal care.  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  Patient reports no complaints.  Contractions: Not present. Vag. Bleeding: None.  Movement: Present. Denies leaking of fluid/ROM.   The following portions of the patient's history were reviewed and updated as appropriate: allergies, current medications, past family history, past medical history, past social history, past surgical history and problem list. Problem list updated.  Objective:   Vitals:   11/27/21 1616  BP: 100/63  Pulse: 86  Temp: (!) 97.4 F (36.3 C)  Weight: 148 lb 6.4 oz (67.3 kg)    Fetal Status: Fetal Heart Rate (bpm): 144 Fundal Height: 30 cm Movement: Present     General:  Alert, oriented and cooperative. Patient is in no acute distress.  Skin: Skin is warm and dry. No rash noted.   Cardiovascular: Normal heart rate noted  Respiratory: Normal respiratory effort, no problems with respiration noted  Abdomen: Soft, gravid, appropriate for gestational age.  Pain/Pressure: Absent     Pelvic: Cervical exam deferred        Extremities: Normal range of motion.  Edema: None   Mental Status: Normal mood and affect. Normal behavior. Normal judgment and thought content.   Assessment and Plan:  Pregnancy: G3P1102 at [redacted]w[redacted]d  1. Supervision of high risk pregnancy in first trimester 13 lb 6.4 oz (6.078 kg) Not exercising Working 51 hrs/wk Had fetal Echo 11/03/21 Has f/u growth u/s 12/01/21 Had GI consult 10/13/21 for epigastric pain; abdominal u/s neg, H pylori neg. Rx'd Prevacid 15 mg qday, Miralax, f/u 8-12 wks Doesn't have crib or car seat yet    2. H/O cesarean section 09/09/13 Has [place] TOLAC apt 12/02/21  3. History of preterm delivery at 35 wks 10/16/2008 4#4   4. Antepartum multigravida of advanced maternal age 64 yo Genetic counseling done 08/25/21 and Nips neg  5. large right cleft lip with possible palate involvement on 09/22/21 u/s Had fetal Echo 11/03/21 wnl Needs apt with cleft team in third trimester  6. Low birth weight infants x2 (5#8, 4#4) Monitor q 4 wks--next growth u/s 12/01/21  Preterm labor symptoms and general obstetric precautions including but not limited to vaginal bleeding, contractions, leaking of fluid and fetal movement were reviewed in detail with the patient. Please refer to After Visit Summary for other counseling recommendations.  Return in about 2 weeks (around 12/11/2021) for routine PNC.  Future Appointments  Date Time Provider [place]  12/01/2021  2:00 PM [facility]  12/11/2021  4:00 PM [facility] PROVIDER AC-[facility] None    Herbie Saxon, CNM

## 2021-11-27 NOTE — Progress Notes (Signed)
Patient here for MH RV at 65 5/7. Desires flu vaccine today, given and tolerated well. VIS given and NCIR given. Kick counts reviewed with patient and 3 cards given. Patient aware of [facility] U/S on 12/01/21 and [place] appointment on 12/02/21. Marland KitchenJenetta Downer, RN

## 2021-12-01 ENCOUNTER — Ambulatory Visit: Payer: Medicaid Other | Attending: Maternal & Fetal Medicine

## 2021-12-01 ENCOUNTER — Other Ambulatory Visit: Payer: Self-pay

## 2021-12-01 VITALS — BP 104/68 | HR 91 | Temp 98.0°F | Ht 59.0 in | Wt 150.0 lb

## 2021-12-01 DIAGNOSIS — O09522 Supervision of elderly multigravida, second trimester: Secondary | ICD-10-CM | POA: Diagnosis not present

## 2021-12-01 DIAGNOSIS — Z362 Encounter for other antenatal screening follow-up: Secondary | ICD-10-CM | POA: Diagnosis not present

## 2021-12-01 DIAGNOSIS — O09293 Supervision of pregnancy with other poor reproductive or obstetric history, third trimester: Secondary | ICD-10-CM | POA: Insufficient documentation

## 2021-12-01 DIAGNOSIS — O34219 Maternal care for unspecified type scar from previous cesarean delivery: Secondary | ICD-10-CM | POA: Insufficient documentation

## 2021-12-01 DIAGNOSIS — O09213 Supervision of pregnancy with history of pre-term labor, third trimester: Secondary | ICD-10-CM

## 2021-12-01 DIAGNOSIS — O283 Abnormal ultrasonic finding on antenatal screening of mother: Secondary | ICD-10-CM | POA: Diagnosis not present

## 2021-12-01 DIAGNOSIS — O09523 Supervision of elderly multigravida, third trimester: Secondary | ICD-10-CM | POA: Insufficient documentation

## 2021-12-01 DIAGNOSIS — O35AXX0 Maternal care for other (suspected) fetal abnormality and damage, fetal facial anomalies, not applicable or unspecified: Secondary | ICD-10-CM | POA: Diagnosis not present

## 2021-12-01 DIAGNOSIS — Z3A31 31 weeks gestation of pregnancy: Secondary | ICD-10-CM | POA: Insufficient documentation

## 2021-12-01 DIAGNOSIS — Z3A27 27 weeks gestation of pregnancy: Secondary | ICD-10-CM

## 2021-12-01 DIAGNOSIS — O09892 Supervision of other high risk pregnancies, second trimester: Secondary | ICD-10-CM

## 2021-12-01 DIAGNOSIS — J018 Other acute sinusitis: Secondary | ICD-10-CM

## 2021-12-11 ENCOUNTER — Ambulatory Visit: Payer: Medicaid Other | Admitting: Advanced Practice Midwife

## 2021-12-11 ENCOUNTER — Encounter: Payer: Self-pay | Admitting: Advanced Practice Midwife

## 2021-12-11 VITALS — BP 103/75 | HR 96 | Temp 97.6°F | Wt 150.2 lb

## 2021-12-11 DIAGNOSIS — R772 Abnormality of alphafetoprotein: Secondary | ICD-10-CM

## 2021-12-11 DIAGNOSIS — O09529 Supervision of elderly multigravida, unspecified trimester: Secondary | ICD-10-CM

## 2021-12-11 DIAGNOSIS — Q369 Cleft lip, unilateral: Secondary | ICD-10-CM

## 2021-12-11 DIAGNOSIS — O0991 Supervision of high risk pregnancy, unspecified, first trimester: Secondary | ICD-10-CM

## 2021-12-11 DIAGNOSIS — Z98891 History of uterine scar from previous surgery: Secondary | ICD-10-CM

## 2021-12-11 NOTE — Progress Notes (Signed)
[place] Department [facility] Clinic  PRENATAL VISIT NOTE  Subjective:  Courtney Newman is a 36 y.o. G3P1102 at [redacted]w[redacted]d being seen today for ongoing prenatal care.  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  Patient reports no complaints.  Contractions: Not present. Vag. Bleeding: None.  Movement: Present. Denies leaking of fluid/ROM.   The following portions of the patient's history were reviewed and updated as appropriate: allergies, current medications, past family history, past medical history, past social history, past surgical history and problem list. Problem list updated.  Objective:   Vitals:   12/11/21 1647  BP: 103/75  Pulse: 96  Temp: 97.6 F (36.4 C)  Weight: 150 lb 3.2 oz (68.1 kg)    Fetal Status: Fetal Heart Rate (bpm): 145 Fundal Height: 32 cm Movement: Present     General:  Alert, oriented and cooperative. Patient is in no acute distress.  Skin: Skin is warm and dry. No rash noted.   Cardiovascular: Normal heart rate noted  Respiratory: Normal respiratory effort, no problems with respiration noted  Abdomen: Soft, gravid, appropriate for gestational age.  Pain/Pressure: Absent     Pelvic: Cervical exam deferred        Extremities: Normal range of motion.  Edema: None  Mental Status: Normal mood and  affect. Normal behavior. Normal judgment and thought content.   Assessment and Plan:  Pregnancy: G3P1102 at [redacted]w[redacted]d  1. Supervision of high risk pregnancy in first trimester Working 40 hrs/wk No car seat or crib yet Not exercising Reviewed 12/01/21 growth u/s at 31 2/7 with anterior placenta, AFI wnl, EFW=25% Has f/u growth u/s 12/31/21  2. large right cleft lip with possible palate involvement on 09/22/21 u/s Has apt with baby's surgeon 12/28/21 at [place]  3. Antepartum multigravida of advanced maternal age 61 yo Had genetic counseling apt 08/25/21 Fetal Echo 11/03/21 wnl Nips neg  4. H/O cesarean section 09/09/13 Had [place] apt and delivery plans apt 12/02/21 Approved for TOLAC but will get BTL 6 wks pp  5. Abnormal AFP3 test with 1:230 chance of OSB 08/18/21 Genetic counseling done 08/25/21 Fetal echo 11/03/21 wnl Nips neg   Preterm labor symptoms and general obstetric precautions including but not limited to vaginal bleeding, contractions, leaking of fluid and fetal movement were reviewed in detail with the patient. Please refer to After Visit Summary for other counseling recommendations.  Return in 2 weeks (on 12/25/2021).  Future Appointments  Date Time Provider [place]  12/25/2021  4:00 PM [facility] PROVIDER [facility] None  12/29/2021  4:00 PM [facility] [place], CNM

## 2021-12-11 NOTE — Progress Notes (Signed)
Kept [facility] [place] appt 12/01/2021 and aware of follow-up US appt on 12/31/21 at 1600. Per client, has appt with surgeon regarding baby's lip at [facility] on 12/28/21 at 1310. Rich Number, RN

## 2021-12-25 ENCOUNTER — Ambulatory Visit: Payer: Medicaid Other | Admitting: Nurse Practitioner

## 2021-12-25 VITALS — BP 108/76 | HR 87 | Temp 97.4°F | Wt 155.0 lb

## 2021-12-25 DIAGNOSIS — O2340 Unspecified infection of urinary tract in pregnancy, unspecified trimester: Secondary | ICD-10-CM

## 2021-12-25 DIAGNOSIS — O0993 Supervision of high risk pregnancy, unspecified, third trimester: Secondary | ICD-10-CM

## 2021-12-25 DIAGNOSIS — O2343 Unspecified infection of urinary tract in pregnancy, third trimester: Secondary | ICD-10-CM

## 2021-12-25 DIAGNOSIS — O0991 Supervision of high risk pregnancy, unspecified, first trimester: Secondary | ICD-10-CM

## 2021-12-25 DIAGNOSIS — R1013 Epigastric pain: Secondary | ICD-10-CM

## 2021-12-25 DIAGNOSIS — Q369 Cleft lip, unilateral: Secondary | ICD-10-CM

## 2021-12-25 LAB — URINALYSIS
Bilirubin, UA: NEGATIVE
Glucose, UA: NEGATIVE
Ketones, UA: NEGATIVE
Nitrite, UA: NEGATIVE
Protein,UA: NEGATIVE
RBC, UA: NEGATIVE
Specific Gravity, UA: 1.02 (ref 1.005–1.030)
Urobilinogen, Ur: 0.2 mg/dL (ref 0.2–1.0)
pH, UA: 5.5 (ref 5.0–7.5)

## 2021-12-25 NOTE — Progress Notes (Unsigned)
[place] Department [facility] Clinic  PRENATAL VISIT NOTE  Subjective:  Courtney Newman is a 36 y.o. G3P1102 at [redacted]w[redacted]d being seen today for ongoing prenatal care.  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  Patient reports backache and lower abdominal pressure.  Contractions: Irritability. Vag. Bleeding: None.  Movement: Present. Denies leaking of fluid/ROM.   The following portions of the patient's history were reviewed and updated as appropriate: allergies, current medications, past family history, past medical history, past social history, past surgical history and problem list. Problem list updated.  Objective:   Vitals:   12/25/21 1626  BP: 108/76  Pulse: 87  Temp: (!) 97.4 F (36.3 C)  Weight: 155 lb (70.3 kg)    Fetal Status: Fetal Heart Rate (bpm): 145 Fundal Height: 33 cm Movement: Present     General:  Alert, oriented and cooperative. Patient is in no acute distress.  Skin: Skin is warm and dry. No rash noted.   Cardiovascular: Normal heart rate noted  Respiratory: Normal respiratory effort, no problems with respiration noted  Abdomen: Soft, gravid, appropriate for gestational age.  Pain/Pressure: Present     Pelvic: Cervical exam deferred        Extremities: Normal range of motion.  Edema: None  Mental  Status: Normal mood and affect. Normal behavior. Normal judgment and thought content.   Assessment and Plan:  Pregnancy: G3P1102 at [redacted]w[redacted]d  1. Supervision of high risk pregnancy in first trimester -36 year old female in clinic today for prenatal care.  -ROS reviewed, patient with complaints of back pain and lower abdominal pressure.  Urine Dip collected  today to rule out UTI.  History of UTI x 3, currently not on suppressive therapy.   -Urine dip=negative, with trace of leukocytes. Larence Penning MFM F/U US 12/29/21.    - Urinalysis (Urine Dip)  2. Epigastric pain -Patient states epigastric pain has improved.  Patient continues to take Pepcid.    3. large right cleft lip with possible palate involvement on 09/22/21 u/s -12/28/21 surgical  appointment.    Term labor symptoms and general obstetric precautions including but not limited to vaginal bleeding, contractions, leaking of fluid and fetal movement were reviewed in detail with the patient. Please refer to After Visit Summary for other counseling recommendations.   Return in 2 weeks (on 01/08/2022) for [facility] RV appt.  Future Appointments  Date Time Provider [place]  12/29/2021  1:00 PM [facility]  01/08/2022  4:00 PM [facility]-MH PROVIDER AC-MAT None    Gregary Cromer, FNP

## 2021-12-25 NOTE — Progress Notes (Unsigned)
Aware of 12/28/21 appt with surgeon at [facility] regarding infant cleft lip / palate and aware of 12/29/21 [place] [facility] [place] appt. Rich Number, RN

## 2021-12-28 ENCOUNTER — Other Ambulatory Visit: Payer: Self-pay

## 2021-12-28 DIAGNOSIS — O35AXX0 Maternal care for other (suspected) fetal abnormality and damage, fetal facial anomalies, not applicable or unspecified: Secondary | ICD-10-CM

## 2021-12-28 DIAGNOSIS — O09522 Supervision of elderly multigravida, second trimester: Secondary | ICD-10-CM

## 2021-12-28 DIAGNOSIS — O34219 Maternal care for unspecified type scar from previous cesarean delivery: Secondary | ICD-10-CM

## 2021-12-29 ENCOUNTER — Ambulatory Visit: Payer: Medicaid Other | Attending: Obstetrics

## 2021-12-29 ENCOUNTER — Other Ambulatory Visit: Payer: Self-pay

## 2021-12-29 VITALS — BP 106/76 | HR 86 | Temp 98.0°F | Ht 59.0 in | Wt 155.0 lb

## 2021-12-29 DIAGNOSIS — Z362 Encounter for other antenatal screening follow-up: Secondary | ICD-10-CM | POA: Diagnosis not present

## 2021-12-29 DIAGNOSIS — O09293 Supervision of pregnancy with other poor reproductive or obstetric history, third trimester: Secondary | ICD-10-CM | POA: Diagnosis not present

## 2021-12-29 DIAGNOSIS — O283 Abnormal ultrasonic finding on antenatal screening of mother: Secondary | ICD-10-CM | POA: Diagnosis present

## 2021-12-29 DIAGNOSIS — O34219 Maternal care for unspecified type scar from previous cesarean delivery: Secondary | ICD-10-CM

## 2021-12-29 DIAGNOSIS — O09523 Supervision of elderly multigravida, third trimester: Secondary | ICD-10-CM

## 2021-12-29 DIAGNOSIS — O09213 Supervision of pregnancy with history of pre-term labor, third trimester: Secondary | ICD-10-CM | POA: Diagnosis not present

## 2021-12-29 DIAGNOSIS — Z3A35 35 weeks gestation of pregnancy: Secondary | ICD-10-CM | POA: Insufficient documentation

## 2021-12-29 DIAGNOSIS — J018 Other acute sinusitis: Secondary | ICD-10-CM

## 2021-12-29 DIAGNOSIS — O35AXX0 Maternal care for other (suspected) fetal abnormality and damage, fetal facial anomalies, not applicable or unspecified: Secondary | ICD-10-CM | POA: Diagnosis not present

## 2021-12-29 DIAGNOSIS — O09522 Supervision of elderly multigravida, second trimester: Secondary | ICD-10-CM

## 2021-12-29 MED ORDER — NITROFURANTOIN MACROCRYSTAL 100 MG PO CAPS: 100.0000 mg | ORAL_CAPSULE | Freq: Every day | ORAL | 1 refills | Status: DC

## 2021-12-29 NOTE — Patient Instructions (Signed)
Please excuse patient from work today due to a appointment at the hospital.   Diannia Ruder, RN

## 2022-01-04 ENCOUNTER — Encounter: Payer: Self-pay | Admitting: *Deleted

## 2022-01-04 ENCOUNTER — Ambulatory Visit: Payer: Medicaid Other

## 2022-01-04 ENCOUNTER — Ambulatory Visit: Payer: Medicaid Other | Attending: Obstetrics

## 2022-01-04 ENCOUNTER — Ambulatory Visit: Payer: Medicaid Other | Admitting: *Deleted

## 2022-01-04 DIAGNOSIS — O34219 Maternal care for unspecified type scar from previous cesarean delivery: Secondary | ICD-10-CM | POA: Insufficient documentation

## 2022-01-04 DIAGNOSIS — O09522 Supervision of elderly multigravida, second trimester: Secondary | ICD-10-CM | POA: Insufficient documentation

## 2022-01-04 DIAGNOSIS — O35AXX0 Maternal care for other (suspected) fetal abnormality and damage, fetal facial anomalies, not applicable or unspecified: Secondary | ICD-10-CM | POA: Diagnosis not present

## 2022-01-08 ENCOUNTER — Ambulatory Visit: Payer: Medicaid Other | Admitting: Advanced Practice Midwife

## 2022-01-08 VITALS — BP 110/75 | HR 83 | Temp 97.2°F | Wt 157.0 lb

## 2022-01-08 DIAGNOSIS — O09529 Supervision of elderly multigravida, unspecified trimester: Secondary | ICD-10-CM

## 2022-01-08 DIAGNOSIS — O09523 Supervision of elderly multigravida, third trimester: Secondary | ICD-10-CM

## 2022-01-08 DIAGNOSIS — Z98891 History of uterine scar from previous surgery: Secondary | ICD-10-CM

## 2022-01-08 DIAGNOSIS — O0991 Supervision of high risk pregnancy, unspecified, first trimester: Secondary | ICD-10-CM

## 2022-01-08 DIAGNOSIS — O0993 Supervision of high risk pregnancy, unspecified, third trimester: Secondary | ICD-10-CM

## 2022-01-08 DIAGNOSIS — O2343 Unspecified infection of urinary tract in pregnancy, third trimester: Secondary | ICD-10-CM

## 2022-01-08 DIAGNOSIS — Z9882 Breast implant status: Secondary | ICD-10-CM

## 2022-01-08 DIAGNOSIS — O2340 Unspecified infection of urinary tract in pregnancy, unspecified trimester: Secondary | ICD-10-CM

## 2022-01-08 DIAGNOSIS — Q369 Cleft lip, unilateral: Secondary | ICD-10-CM

## 2022-01-08 DIAGNOSIS — Z9889 Other specified postprocedural states: Secondary | ICD-10-CM

## 2022-01-08 NOTE — Progress Notes (Signed)
[facility] Interpretor utilized. Consent for Social Services signed. Next 2 follow up appointments scheduled. BThiele RN

## 2022-01-08 NOTE — Progress Notes (Addendum)
GBS & GC/CL self collected at 1630. Veronica [facility] Spanish Interpretor utilized. B.Meris Reede RN

## 2022-01-08 NOTE — Progress Notes (Signed)
[place] Department [facility] Clinic  PRENATAL VISIT NOTE  Subjective:  Courtney Newman is a 36 y.o. G3P1102 at [redacted]w[redacted]d being seen today for ongoing prenatal care.  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  Patient reports backache.   .  .   . Denies leaking of fluid/ROM.   The following portions of the patient's history were reviewed and updated as appropriate: allergies, current medications, past family history, past medical history, past social history, past surgical history and problem list. Problem list updated.  Objective:  There were no vitals filed for this visit.  Fetal Status:           General:  Alert, oriented and cooperative. Patient is in no acute distress.  Skin: Skin is warm and dry. No rash noted.   Cardiovascular: Normal heart rate noted  Respiratory: Normal respiratory effort, no problems with respiration noted  Abdomen: Soft, gravid, appropriate for gestational age.        Pelvic: Cervical exam deferred        Extremities: Normal range of motion.     Mental Status: Normal mood and affect. Normal behavior. Normal judgment and thought content.   Assessment and Plan:  Pregnancy: G3P1102 at [redacted]w[redacted]d  1. Supervision of high risk pregnancy in first trimester C/o nausea in pm's--suggestions given C/o backache  at work--suggestions given 20 lb (9.072 kg) Reviewed 12/29/21 growth u/s at 33 6/7 with AFI subjectively low at 8.3 cm, EFW=12%, anterior placenta Reviewed BPP 01/04/22 at 36 1/7 with appropriate growth and AFI wnl; BPP 8/8 Self collected GBS/GC/Chlamydia Kept 12/28/21 apt with baby's surgeon; not sure yet if cleft palate or not Accepts counseling with Milton Ferguson, LCSW; to sign behavioral health form today Working 40 hrs/wk Has car seat and crib Knows when to go to L&D  - GBS Culture - Chlamydia/GC NAA, Confirmation  2. UTI #3 09/11/21 GBS Taking suppressive tx Macrodantin qHS  3. H/O cesarean section 09/09/13 Wants TOLAC and had [place] delivery plans apt on 12/02/21 and approved but will have BTL at 6 wks pp due to hx of tummy tuck  - GBS Culture - Chlamydia/GC NAA, Confirmation - Ambulatory referral to [place] 4. large right cleft lip with possible palate involvement on 09/22/21 u/s Unsure if palate involved  5. Antepartum multigravida of advanced maternal age 36 yo Genetic counseling done 08/25/21 with NIPS neg  6. Hx of breast augmentation 2017 [place] Monitor newborn closely  7. Hx of tummy tuck 2017 [facility] wants her to have BTL at 6 wks pp  8. Low birth weight infants x2 (5#8, 4#4) EFW=12% on 12/29/21   Preterm labor symptoms and general obstetric precautions including but not limited to vaginal bleeding, contractions, leaking of fluid and fetal movement were reviewed in detail with the patient. Please refer to After Visit Summary for other counseling recommendations.  Return in about 1  week (around 01/15/2022) for routine PNC.  No future appointments.  Herbie Saxon, CNM

## 2022-01-12 ENCOUNTER — Telehealth: Payer: Self-pay | Admitting: Family Medicine

## 2022-01-12 ENCOUNTER — Encounter: Payer: Self-pay | Admitting: Advanced Practice Midwife

## 2022-01-12 ENCOUNTER — Other Ambulatory Visit: Payer: Self-pay

## 2022-01-12 ENCOUNTER — Observation Stay
Admission: EM | Admit: 2022-01-12 | Discharge: 2022-01-12 | Disposition: A | Discharge status: Home / Self Care | Payer: Medicaid Other | Attending: Obstetrics and Gynecology | Admitting: Obstetrics and Gynecology

## 2022-01-12 DIAGNOSIS — Z79899 Other long term (current) drug therapy: Secondary | ICD-10-CM | POA: Insufficient documentation

## 2022-01-12 DIAGNOSIS — Z98891 History of uterine scar from previous surgery: Secondary | ICD-10-CM | POA: Diagnosis not present

## 2022-01-12 DIAGNOSIS — J018 Other acute sinusitis: Secondary | ICD-10-CM

## 2022-01-12 DIAGNOSIS — Z3A37 37 weeks gestation of pregnancy: Secondary | ICD-10-CM | POA: Insufficient documentation

## 2022-01-12 DIAGNOSIS — O479 False labor, unspecified: Secondary | ICD-10-CM | POA: Diagnosis present

## 2022-01-12 DIAGNOSIS — O09523 Supervision of elderly multigravida, third trimester: Secondary | ICD-10-CM | POA: Diagnosis not present

## 2022-01-12 DIAGNOSIS — O99891 Other specified diseases and conditions complicating pregnancy: Secondary | ICD-10-CM | POA: Diagnosis present

## 2022-01-12 DIAGNOSIS — M545 Low back pain, unspecified: Secondary | ICD-10-CM | POA: Insufficient documentation

## 2022-01-12 DIAGNOSIS — O471 False labor at or after 37 completed weeks of gestation: Principal | ICD-10-CM | POA: Insufficient documentation

## 2022-01-12 DIAGNOSIS — M549 Dorsalgia, unspecified: Secondary | ICD-10-CM | POA: Diagnosis present

## 2022-01-12 LAB — URINALYSIS, COMPLETE (UACMP) WITH MICROSCOPIC
Bilirubin Urine: NEGATIVE
Glucose, UA: NEGATIVE mg/dL
Hgb urine dipstick: NEGATIVE
Ketones, ur: NEGATIVE mg/dL
Nitrite: NEGATIVE
Protein, ur: NEGATIVE mg/dL
Specific Gravity, Urine: 1.015 (ref 1.005–1.030)
pH: 6 (ref 5.0–8.0)

## 2022-01-12 MED ORDER — ONDANSETRON HCL 4 MG PO TABS
4.0000 mg | ORAL_TABLET | Freq: Three times a day (TID) | ORAL | Status: DC | PRN
  Administered 2022-01-12: 4 mg via ORAL
  Filled 2022-01-12: qty 1

## 2022-01-12 MED ORDER — ACETAMINOPHEN 500 MG PO TABS
1000.0000 mg | ORAL_TABLET | Freq: Four times a day (QID) | ORAL | Status: DC | PRN
  Administered 2022-01-12: 1000 mg via ORAL
  Filled 2022-01-12: qty 2

## 2022-01-12 MED ORDER — CALCIUM CARBONATE ANTACID 500 MG PO CHEW: 2.0000 | CHEWABLE_TABLET | ORAL | Status: DC | PRN

## 2022-01-12 NOTE — Telephone Encounter (Signed)
RN return call regarding back pain at 08:45 am.   Patient states back pain is in lower back that wraps around into the lower abdomen. States she feels hardening in her stomach that comes and go. Reports positive fetal movement. Denies LOF. States pain started yesterday morning at 6am and throughout the day yesterday and into this morning she has had strong back pain every 2 hours that last about 10 mins and within those 10 mins she has intermittent tighten of her stomach. She states back pain was so intense that she was at work this morning and had to go home. Patient is currently at home and suggested to patient to hydrate and take a warm shower and see what happens with her contractions. If contractions continue suggested she go to labor and delivery for triage to rule out labor vs braxton hicks.  Patient verbalized understating.   Al Decant, RN

## 2022-01-12 NOTE — OB Triage Note (Signed)
Patient is a [id number] at [redacted]w[redacted]d who presents to unit c/o lower back pain wrapping around towards the front of her belly q10 minutes since yesterday morning. Patient reports her baby moving less today and swollen feet since she is up walking all day at work. Denies LOF and vaginal bleeding. History of c-section in 2015 and would like to [facility]. External monitors applied and assessing. Initial FHT 140. Vital signs WDL.

## 2022-01-12 NOTE — OB Triage Note (Signed)
Discharge instructions, strict labor precautions, and follow-up care reviewed with patient and significant other. All questions answered. Patient verbalized understanding. Discharged ambulatory off unit.

## 2022-01-12 NOTE — Discharge Summary (Signed)
Courtney Newman is a 36 y.o. female. She is at [redacted]w[redacted]d gestation. Patient's last menstrual period was 04/17/2021 (exact date). 01/31/2022, by Ultrasound   Prenatal care site: [facility]  Chief complaint: lower back pain   HPI: [name] presents to L&D with complaints of back pain that wraps around to her abdomen.  Pain is intermittent and happening about every 10 minutes.  She denies LOF or vaginal bleeding.  Endorses fetal movement but reports it's felt like less movement overall today.  She has a history of a vaginal birth at 81 weeks followed by a cesarean birth for breech presentation.  She desires a TOLAC for this pregnancy and has received appropriate counseling.   Factors complicating pregnancy: Elevated AFP Fetal cleft lip/palate  History of preterm birth  GBS pos  Previous c/s x 1  AMA  S: Resting comfortably. no VB.no LOF,  Active fetal movement.   Maternal Medical History:  Past Medical Hx:  has a past medical history of Ear infection (06/2021) and Preterm labor.    Past Surgical Hx:  has a past surgical history that includes Cesarean section (09/09/2013) and Placement of breast implants (Bilateral, 2017).   No Known Allergies   Prior to Admission medications   Medication Sig Start Date End Date Taking? Authorizing Provider  famotidine (PEPCID) 10 MG tablet Take 10 mg by mouth daily. Prn use   Yes [provider]  nitrofurantoin (MACRODANTIN) 100 MG capsule Take 1 capsule (100 mg total) by mouth at bedtime. 12/29/21  Yes Gregary Cromer, FNP  Prenatal Vit-Fe Fumarate-FA (PRENATAL MULTIVITAMIN) TABS tablet Take 1 tablet by mouth daily at 12 noon.   Yes [provider]    Social History: She  reports that she has never smoked. She has never been exposed to tobacco smoke. She has never used smokeless tobacco. She reports that she does not currently use alcohol. She reports that she does not use drugs.  Family History: family history includes Diabetes in her mother;  Healthy in her father. ,no history of gyn cancers   Review of Systems: A full review of systems was performed and negative except as noted in the HPI.     Pertinent Results:  Prenatal Labs: Blood type/Rh O/Positive/-- (05/11 1620)   Antibody screen Negative    Rubella 15.20 (05/11 1620)   Varicella Immune  RPR Non Reactive (09/08 1625)   HBsAg Negative (05/11 1620)  Hep C NR   HIV NR    GC neg  Chlamydia neg  Genetic screening cfDNA negative/AFP elevated   1 hour GTT 107  3 hour GTT N/A  GBS Pos - GBS bacteruria       O:  BP 119/78 (BP Location: Right Arm)   Pulse 74   Temp 98.1 F (36.7 C) (Oral)   Resp 15   Ht 4\' 11"  (1.499 m)   Wt 71.2 kg   LMP 04/17/2021 (Exact Date) Comment: light menses lasting 2 days  BMI 31.71 kg/m  Results for orders placed or performed during the hospital encounter of 01/12/22 (from the past 48 hour(s))  Urinalysis, Complete w Microscopic Urine, Clean Catch   Collection Time: 01/12/22  1:45 PM  Result Value Ref Range   Color, Urine YELLOW (A) YELLOW   APPearance HAZY (A) CLEAR   Specific Gravity, Urine 1.015 1.005 - 1.030   pH 6.0 5.0 - 8.0   Glucose, UA NEGATIVE NEGATIVE mg/dL   Hgb urine dipstick NEGATIVE NEGATIVE   Bilirubin Urine NEGATIVE NEGATIVE   Ketones, ur  NEGATIVE NEGATIVE mg/dL   Protein, ur NEGATIVE NEGATIVE mg/dL   Nitrite NEGATIVE NEGATIVE   Leukocytes,Ua SMALL (A) NEGATIVE   RBC / HPF 0-5 0 - 5 RBC/hpf   WBC, UA 0-5 0 - 5 WBC/hpf   Bacteria, UA RARE (A) NONE SEEN   Squamous Epithelial / LPF 0-5 0 - 5   Mucus PRESENT      Constitutional: NAD, AAOx3  HE/ENT: extraocular movements grossly intact, moist mucous membranes CV: RRR PULM: nl respiratory effort Abd: gravid, non-tender, non-distended, soft  Ext: Non-tender, Nonedmeatous Psych: mood appropriate, speech normal SVE: Dilation: 3 Effacement (%): 60 Cervical Position: Middle Station: -2 Presentation: Vertex Exam by:: A Norma Fredrickson RN  -No cervical change over  2 hours   NST: Baseline FHR: 140 beats/min Variability: moderate Accelerations: present Decelerations: absent Tocometry: Mild contractions 2-6 minutes   Interpretation: Category I INDICATIONS: rule out uterine contractions RESULTS:  A NST procedure was performed with FHR monitoring and a normal baseline established, appropriate time of 20-40 minutes of evaluation, and accels >2 seen w 15x15 characteristics.  Results show a REACTIVE NST.   Assessment: 36 y.o. [id number] [redacted]w[redacted]d 01/31/2022, by Ultrasound   Principle diagnosis: Uterine contractions [O47.9] Back pain affecting pregnancy [O99.891, M54.9]   Plan: 1) Reactive NST  -Category 1 tracing  -Reassuring fetal status   2) Labor: Not present  -Discussed warning signs to return to L&D triage with  -Reviewed comfort measures   3) Disposition: discharge home stable -Precautions reviewed  -Follow up as scheduled    ----- Drinda Butts, CNM Certified Nurse Midwife [facility]

## 2022-01-12 NOTE — Telephone Encounter (Signed)
Pt with pain in the back since yesterday morning, she states the pain comes every 2 hours and it is very difficult for her to stand. She returned from work this morning due to the intense pain. Please call her back asap with interpreter (Spanish) for further instructions. Thanks

## 2022-01-13 LAB — CULTURE, BETA STREP (GROUP B ONLY): Strep Gp B Culture: POSITIVE — AB

## 2022-01-13 LAB — CHLAMYDIA/GC NAA, CONFIRMATION
Chlamydia trachomatis, NAA: NEGATIVE
Neisseria gonorrhoeae, NAA: NEGATIVE

## 2022-01-14 ENCOUNTER — Ambulatory Visit: Payer: Medicaid Other

## 2022-01-14 ENCOUNTER — Ambulatory Visit: Payer: Medicaid Other | Admitting: Physician Assistant

## 2022-01-14 ENCOUNTER — Encounter: Payer: Self-pay | Admitting: Physician Assistant

## 2022-01-14 VITALS — BP 126/80 | HR 81 | Temp 97.5°F | Wt 159.4 lb

## 2022-01-14 DIAGNOSIS — Z98891 History of uterine scar from previous surgery: Secondary | ICD-10-CM

## 2022-01-14 DIAGNOSIS — O0991 Supervision of high risk pregnancy, unspecified, first trimester: Secondary | ICD-10-CM

## 2022-01-14 DIAGNOSIS — O2343 Unspecified infection of urinary tract in pregnancy, third trimester: Secondary | ICD-10-CM

## 2022-01-14 DIAGNOSIS — O0993 Supervision of high risk pregnancy, unspecified, third trimester: Secondary | ICD-10-CM

## 2022-01-14 DIAGNOSIS — O2340 Unspecified infection of urinary tract in pregnancy, unspecified trimester: Secondary | ICD-10-CM

## 2022-01-14 NOTE — Progress Notes (Signed)
[facility]  PRENATAL VISIT NOTE  Subjective:  Courtney Newman is a 36 y.o. G3P1102 at [redacted]w[redacted]d being seen today for ongoing prenatal care.  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  Patient reports  ED/L&D visit for contractions 2 days ago, was told she was 3 cm dil. Had mucus discharge afterward. Has had some nausea and vomiting. 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 Feels she is unable to continue working at this stage in her pregnancy and requests work note until after baby is born. Also has hemorrhoid discomfort .  Contractions: Irregular. Vag. Bleeding: None.  Movement: Present. Contractions are at most 1 hour apart. Denies leaking of fluid/ROM.   The following portions of the patient's history were reviewed and updated as appropriate: allergies, current medications, past family history, past medical history, past social history, past surgical history and problem list. Problem list updated.  Objective:   Vitals:   01/14/22 1511  BP: 126/80  Pulse: 81  Temp: (!) 97.5 F (36.4 C)  Weight: 159 lb 6.4 oz (72.3 kg)    Fetal Status: Fetal Heart Rate (bpm): 140 Fundal Height: 38 cm Movement: Present   Presentation: Vertex  General:  Alert, oriented and cooperative. Patient is in no acute distress.  Skin: Skin is warm and dry. No rash noted.   Cardiovascular: Normal heart rate noted  Respiratory: Normal respiratory effort, no problems with respiration noted  Abdomen: Soft, gravid, appropriate for gestational age.  Pain/Pressure: Present     Pelvic: Cervical exam deferred        Extremities: Normal range of motion.  Edema: Mild pitting, slight indentation  Mental Status: Normal mood and affect. Normal behavior. Normal judgment and thought content.   Assessment and Plan:  Pregnancy: G3P1102 at [redacted]w[redacted]d  1. Supervision of high risk pregnancy in first trimester Possible prodromal labor in advanced pregnancy. Last worked 01/11/22. Note written to be out of work 01/12/22 til 6 wk after [facility]. Enc fluids/dietary fiber and walking for hemorrhoids.  2. UTI #3 09/11/21 GBS Continue antibiotic prophylaxis.  3. H/O cesarean section 09/09/13 Desires VBAC. Has been approved for TOLAC.   Term labor symptoms and general obstetric precautions including but not limited to vaginal bleeding, contractions, leaking of fluid and fetal movement were reviewed in detail with the patient. Please refer to After Visit Summary for other counseling recommendations.  Return in about 1 week (around 01/21/2022) for Routine prenatal care.  Future Appointments  Date Time Provider Department Center  01/22/2022  3:40 PM [facility] PROVIDER [facility] None    Landry Dyke, PA-C

## 2022-01-16 ENCOUNTER — Inpatient Hospital Stay
Admission: EM | Admit: 2022-01-16 | Discharge: 2022-01-18 | DRG: 798 | Disposition: A | Discharge status: Home / Self Care | Payer: Medicaid Other | Attending: Obstetrics | Admitting: Obstetrics

## 2022-01-16 ENCOUNTER — Encounter: Payer: Self-pay | Admitting: Obstetrics and Gynecology

## 2022-01-16 ENCOUNTER — Other Ambulatory Visit: Payer: Self-pay

## 2022-01-16 ENCOUNTER — Inpatient Hospital Stay: Payer: Medicaid Other | Admitting: Anesthesiology

## 2022-01-16 DIAGNOSIS — Z302 Encounter for sterilization: Secondary | ICD-10-CM | POA: Diagnosis not present

## 2022-01-16 DIAGNOSIS — O99824 Streptococcus B carrier state complicating childbirth: Secondary | ICD-10-CM | POA: Diagnosis present

## 2022-01-16 DIAGNOSIS — Z9882 Breast implant status: Secondary | ICD-10-CM

## 2022-01-16 DIAGNOSIS — O26893 Other specified pregnancy related conditions, third trimester: Secondary | ICD-10-CM | POA: Diagnosis present

## 2022-01-16 DIAGNOSIS — Z87730 Personal history of (corrected) cleft lip and palate: Secondary | ICD-10-CM | POA: Diagnosis not present

## 2022-01-16 DIAGNOSIS — O4292 Full-term premature rupture of membranes, unspecified as to length of time between rupture and onset of labor: Principal | ICD-10-CM | POA: Diagnosis present

## 2022-01-16 DIAGNOSIS — O34219 Maternal care for unspecified type scar from previous cesarean delivery: Secondary | ICD-10-CM | POA: Diagnosis present

## 2022-01-16 DIAGNOSIS — Z3A37 37 weeks gestation of pregnancy: Secondary | ICD-10-CM | POA: Diagnosis not present

## 2022-01-16 DIAGNOSIS — O0993 Supervision of high risk pregnancy, unspecified, third trimester: Secondary | ICD-10-CM | POA: Diagnosis not present

## 2022-01-16 DIAGNOSIS — Z148 Genetic carrier of other disease: Secondary | ICD-10-CM | POA: Diagnosis not present

## 2022-01-16 DIAGNOSIS — O4290 Premature rupture of membranes, unspecified as to length of time between rupture and onset of labor, unspecified weeks of gestation: Secondary | ICD-10-CM | POA: Diagnosis present

## 2022-01-16 LAB — TYPE AND SCREEN
ABO/RH(D): O POS
Antibody Screen: NEGATIVE

## 2022-01-16 LAB — CBC
HCT: 43.1 % (ref 36.0–46.0)
Hemoglobin: 14.7 g/dL (ref 12.0–15.0)
MCH: 29.4 pg (ref 26.0–34.0)
MCHC: 34.1 g/dL (ref 30.0–36.0)
MCV: 86.2 fL (ref 80.0–100.0)
Platelets: 175 10*3/uL (ref 150–400)
RBC: 5 MIL/uL (ref 3.87–5.11)
RDW: 14.2 % (ref 11.5–15.5)
WBC: 9.2 10*3/uL (ref 4.0–10.5)
nRBC: 0 % (ref 0.0–0.2)

## 2022-01-16 LAB — ABO/RH: ABO/RH(D): O POS

## 2022-01-16 LAB — RUPTURE OF MEMBRANE (ROM)PLUS: Rom Plus: POSITIVE

## 2022-01-16 MED ORDER — TERBUTALINE SULFATE 1 MG/ML IJ SOLN: 0.2500 mg | Freq: Once | INTRAMUSCULAR | Status: DC | PRN

## 2022-01-16 MED ORDER — SOD CITRATE-CITRIC ACID 500-334 MG/5ML PO SOLN: 30.0000 mL | ORAL | Status: DC | PRN

## 2022-01-16 MED ORDER — BUPIVACAINE HCL (PF) 0.5 % IJ SOLN
INTRAMUSCULAR | Status: AC
  Filled 2022-01-16: qty 30

## 2022-01-16 MED ORDER — OXYTOCIN 10 UNIT/ML IJ SOLN
INTRAMUSCULAR | Status: AC
  Filled 2022-01-16: qty 2

## 2022-01-16 MED ORDER — DIPHENHYDRAMINE HCL 50 MG/ML IJ SOLN: 12.5000 mg | INTRAMUSCULAR | Status: DC | PRN

## 2022-01-16 MED ORDER — ACETAMINOPHEN 325 MG PO TABS: 650.0000 mg | ORAL_TABLET | ORAL | Status: DC | PRN

## 2022-01-16 MED ORDER — FENTANYL-BUPIVACAINE-NACL 0.5-0.125-0.9 MG/250ML-% EP SOLN: 12.0000 mL/h | EPIDURAL | Status: DC | PRN

## 2022-01-16 MED ORDER — OXYTOCIN-SODIUM CHLORIDE 30-0.9 UT/500ML-% IV SOLN
2.5000 [IU]/h | INTRAVENOUS | Status: DC
  Filled 2022-01-16: qty 1000

## 2022-01-16 MED ORDER — EPHEDRINE 5 MG/ML INJ: 10.0000 mg | INTRAVENOUS | Status: DC | PRN

## 2022-01-16 MED ORDER — LIDOCAINE HCL (PF) 1 % IJ SOLN
30.0000 mL | INTRAMUSCULAR | Status: DC | PRN
  Filled 2022-01-16: qty 30

## 2022-01-16 MED ORDER — LIDOCAINE-EPINEPHRINE (PF) 1.5 %-1:200000 IJ SOLN
INTRAMUSCULAR | Status: DC | PRN
  Administered 2022-01-16: 3 mL via PERINEURAL

## 2022-01-16 MED ORDER — AMMONIA AROMATIC IN INHA
RESPIRATORY_TRACT | Status: AC
  Filled 2022-01-16: qty 10

## 2022-01-16 MED ORDER — FENTANYL-BUPIVACAINE-NACL 0.5-0.125-0.9 MG/250ML-% EP SOLN
EPIDURAL | Status: AC
  Filled 2022-01-16: qty 250

## 2022-01-16 MED ORDER — LIDOCAINE HCL (PF) 1 % IJ SOLN
INTRAMUSCULAR | Status: DC | PRN
  Administered 2022-01-16 (×2): 3 mL

## 2022-01-16 MED ORDER — OXYTOCIN-SODIUM CHLORIDE 30-0.9 UT/500ML-% IV SOLN
1.0000 m[IU]/min | INTRAVENOUS | Status: DC
  Administered 2022-01-16: 2 m[IU]/min via INTRAVENOUS
  Filled 2022-01-16: qty 500

## 2022-01-16 MED ORDER — ONDANSETRON HCL 4 MG/2ML IJ SOLN: 4.0000 mg | Freq: Four times a day (QID) | INTRAMUSCULAR | Status: DC | PRN

## 2022-01-16 MED ORDER — PHENYLEPHRINE 80 MCG/ML (10ML) SYRINGE FOR IV PUSH (FOR BLOOD PRESSURE SUPPORT): 80.0000 ug | PREFILLED_SYRINGE | INTRAVENOUS | Status: DC | PRN

## 2022-01-16 MED ORDER — CALCIUM CARBONATE ANTACID 500 MG PO CHEW: 2.0000 | CHEWABLE_TABLET | ORAL | Status: DC | PRN

## 2022-01-16 MED ORDER — LIDOCAINE HCL (PF) 1 % IJ SOLN
INTRAMUSCULAR | Status: AC
  Filled 2022-01-16: qty 30

## 2022-01-16 MED ORDER — LACTATED RINGERS IV SOLN: 500.0000 mL | INTRAVENOUS | Status: DC | PRN

## 2022-01-16 MED ORDER — FENTANYL-BUPIVACAINE-NACL 0.5-0.125-0.9 MG/250ML-% EP SOLN
EPIDURAL | Status: DC | PRN
  Administered 2022-01-16: 12 mL/h via EPIDURAL

## 2022-01-16 MED ORDER — BUPIVACAINE HCL (PF) 0.25 % IJ SOLN
INTRAMUSCULAR | Status: DC | PRN
  Administered 2022-01-16 (×2): 5 mL via EPIDURAL

## 2022-01-16 MED ORDER — SODIUM CHLORIDE 0.9 % IV SOLN
5.0000 10*6.[IU] | Freq: Once | INTRAVENOUS | Status: AC
  Administered 2022-01-16: 5 10*6.[IU] via INTRAVENOUS
  Filled 2022-01-16: qty 5

## 2022-01-16 MED ORDER — PENICILLIN G POT IN DEXTROSE 60000 UNIT/ML IV SOLN
3.0000 10*6.[IU] | INTRAVENOUS | Status: DC
  Administered 2022-01-16 (×3): 3 10*6.[IU] via INTRAVENOUS
  Filled 2022-01-16 (×4): qty 50

## 2022-01-16 MED ORDER — OXYTOCIN BOLUS FROM INFUSION
333.0000 mL | Freq: Once | INTRAVENOUS | Status: AC
  Administered 2022-01-17: 333 mL via INTRAVENOUS

## 2022-01-16 MED ORDER — FENTANYL CITRATE (PF) 100 MCG/2ML IJ SOLN
50.0000 ug | INTRAMUSCULAR | Status: DC | PRN
  Administered 2022-01-16: 100 ug via INTRAVENOUS
  Filled 2022-01-16: qty 2

## 2022-01-16 MED ORDER — MISOPROSTOL 200 MCG PO TABS
ORAL_TABLET | ORAL | Status: AC
  Filled 2022-01-16: qty 4

## 2022-01-16 MED ORDER — LACTATED RINGERS IV SOLN: 500.0000 mL | Freq: Once | INTRAVENOUS | Status: DC

## 2022-01-16 MED ORDER — LACTATED RINGERS IV SOLN: INTRAVENOUS | Status: DC

## 2022-01-16 NOTE — Anesthesia Preprocedure Evaluation (Signed)
Anesthesia Evaluation  Patient identified by MRN, date of birth, ID band Patient awake    Reviewed: Allergy & Precautions, NPO status , Patient's Chart, lab work & pertinent test results  History of Anesthesia Complications Negative for: history of anesthetic complications  Airway Mallampati: III  TM Distance: >3 FB Neck ROM: full    Dental  (+) Teeth Intact   Pulmonary neg pulmonary ROS   Pulmonary exam normal        Cardiovascular Exercise Tolerance: Good negative cardio ROS Normal cardiovascular exam     Neuro/Psych negative neurological ROS  negative psych ROS   GI/Hepatic negative GI ROS, Neg liver ROS,,,  Endo/Other  negative endocrine ROS    Renal/GU negative Renal ROS  negative genitourinary   Musculoskeletal   Abdominal   Peds  Hematology negative hematology ROS (+)   Anesthesia Other Findings Past Medical History: 06/2021: Ear infection     Comment:  tx with Amoxicillin No date: Preterm labor  Past Surgical History: 09/09/2013: CESAREAN SECTION 2017: PLACEMENT OF BREAST IMPLANTS; Bilateral  BMI    Body Mass Index: 31.91 kg/m      Reproductive/Obstetrics (+) Pregnancy                             Anesthesia Physical Anesthesia Plan  ASA: 2  Anesthesia Plan: Epidural   Post-op Pain Management:    Induction:   PONV Risk Score and Plan:   Airway Management Planned: Natural Airway  Additional Equipment:   Intra-op Plan:   Post-operative Plan:   Informed Consent: I have reviewed the patients History and Physical, chart, labs and discussed the procedure including the risks, benefits and alternatives for the proposed anesthesia with the patient or authorized representative who has indicated his/her understanding and acceptance.     Dental Advisory Given  Plan Discussed with: Anesthesiologist  Anesthesia Plan Comments: (Patient reports no bleeding  problems and no anticoagulant use.   Patient consented for risks of anesthesia including but not limited to:  - adverse reactions to medications - risk of bleeding, infection and or nerve damage from epidural that could lead to paralysis - risk of headache or failed epidural - nerve damage due to positioning - that if epidural is used for C-section that there is a chance of epidural failure requiring spinal placement or conversion to GA - Damage to heart, brain, lungs, other parts of body or loss of life  Patient voiced understanding.)       Anesthesia Quick Evaluation

## 2022-01-16 NOTE — Anesthesia Procedure Notes (Signed)
Epidural Patient location during procedure: OB Start time: 01/16/2022 8:06 PM End time: 01/16/2022 9:06 PM  Staffing Anesthesiologist: Louie Boston, MD Performed: anesthesiologist   Preanesthetic Checklist Completed: patient identified, IV checked, site marked, risks and benefits discussed, surgical consent, monitors and equipment checked, pre-op evaluation and timeout performed  Epidural Patient position: sitting Prep: ChloraPrep Patient monitoring: heart rate, continuous pulse ox and blood pressure Approach: midline Location: L3-L4 Injection technique: LOR saline  Needle:  Needle type: Tuohy  Needle gauge: 17 G Needle length: 9 cm and 9 Needle insertion depth: 6 cm Catheter type: closed end flexible Catheter size: 19 Gauge Catheter at skin depth: 11 cm Test dose: negative and 1.5% lidocaine with Epi 1:200 K  Assessment Sensory level: T10 Events: blood not aspirated, injection not painful, no injection resistance, no paresthesia and negative IV test  Additional Notes 4 attempts; patient unable to sit still for procedure, aborted at 2045, after discussion and redirection patient wanted to try for epidural again restarted 2103 Pt. Evaluated and documentation done after procedure finished. Patient identified. Risks/Benefits/Options discussed with patient including but not limited to bleeding, infection, nerve damage, paralysis, failed block, incomplete pain control, headache, blood pressure changes, nausea, vomiting, reactions to medication both or allergic, itching and postpartum back pain. Confirmed with bedside nurse the patient's most recent platelet count. Confirmed with patient that they are not currently taking any anticoagulation, have any bleeding history or any family history of bleeding disorders. Patient expressed understanding and wished to proceed. All questions were answered. Sterile technique was used throughout the entire procedure. Please see nursing notes for  vital signs. Test dose was given through epidural catheter and negative prior to continuing to dose epidural or start infusion. Warning signs of high block given to the patient including shortness of breath, tingling/numbness in hands, complete motor block, or any concerning symptoms with instructions to call for help. Patient was given instructions on fall risk and not to get out of bed. All questions and concerns addressed with instructions to call with any issues or inadequate analgesia.    Patient tolerated the insertion well without immediate complications. Reason for block:procedure for pain

## 2022-01-16 NOTE — Progress Notes (Signed)
Labor Check  Subj:  Complaints: }has no unusual complaints  Obj:  BP 132/84 (BP Location: Left Arm)   Pulse 77   Temp 98.4 F (36.9 C) (Oral)   Resp 20   Ht 4\' 11"  (1.499 m)   Wt 71.7 kg   LMP 04/17/2021 (Exact Date) Comment: light menses lasting 2 days  BMI 31.91 kg/m  Dose (milli-units/min) Oxytocin: 18 milli-units/min  Cervix: Dilation: 6 / Effacement (%): 60 / Station: -1  Baseline 06/15/2021: 140 bpm, Variability: Good {> 6 bpm), Accelerations: Reactive, and Decelerations: Absent Contractions: regular, every 2-5 minutes Overall assessment: reassuring    A/P: 36 y.o. 30 female at [redacted]w[redacted]d with TOLAC  1.  Labor: Early latent labor. and Satisfactory labor progress. Labor Progressing normally, fetal wellbBeing Category I, pain controlled  Labor support without medications, and anticipate  NSVD. 2.  [redacted]w[redacted]d assessment: Category I 3.  Group B Strep positive 4. Membranes ruptured, clear fluid 5.  Pain: present - adequately treated and level of pain (1-10, 10 severe), 5 6.  Recheck:Evaluated by digital exam. 7. Continue present management.  TTS:[id number] [facility] 01/16/2022 8:44 PM

## 2022-01-16 NOTE — H&P (Signed)
OB History & Physical   History of Present Illness:   Chief Complaint: leaking of amniotic fluid  HPI:  Courtney Newman is a 36 y.o. [id number] female at [redacted]w[redacted]d, Patient's last menstrual period was 04/17/2021 (exact date)., not consistent with [place] at [redacted]w[redacted]d, with Estimated Date of Delivery: 01/31/22.  She presents to L&D for leaking of amniotic fluid  Reports active fetal movement  Contractions: every 4 to 8 minutes LOF/SROM: clear @ 0600 Vaginal bleeding: none  Factors complicating pregnancy:  Elevated AFP Fetal cleft lip/palate  History of preterm birth  GBS pos  Previous c/s x 1  AMA  Patient Active Problem List   Diagnosis Date Noted   Leakage of amniotic fluid 01/16/2022   Normal labor and delivery 01/16/2022   Uterine contractions 01/12/2022   Back pain affecting pregnancy 01/12/2022   Acute sinus infection 11/04/2021   large right cleft lip with possible palate involvement on 09/22/21 u/s 09/22/2021   Carrier of spinal muscular atrophy 09/22/2021   UTI #3 09/11/21 GBS 09/14/2021   Epigastric pain 09/11/2021   Abnormal AFP3 test with 1:230 chance of OSB 08/18/21 09/11/2021   UTI (urinary tract infection) 07/16/21 GBS 09/11/2021   UTI #2 GBS 08/14/21 09/11/2021   Supervision of high risk pregnancy in first trimester 07/16/2021   History of preterm delivery at 35 wks 10/16/2008 4#4 07/16/2021   H/O cesarean section 09/09/13 07/16/2021   Low birth weight infants x2 (5#8, 4#4) 07/16/2021   short stature 4'11" 07/16/2021   Hx of breast augmentation 2017 [place] 07/16/2021   Hx of tummy tuck 2017 [place] 07/16/2021   Advanced maternal age in multigravida 36 yo 07/16/2021    Prenatal Transfer Tool  Maternal Diabetes: No Genetic Screening: Abnormal:  Results: Elevated AFP Maternal Ultrasounds/Referrals: Normal Fetal Ultrasounds or other Referrals:  Referred to Materal Fetal Medicine  Maternal Substance Abuse:  No Significant Maternal Medications:  None Significant Maternal Lab Results:  Group B Strep positive  Maternal Medical History:   Past Medical History:  Diagnosis Date   Ear infection 06/2021   tx with Amoxicillin   Preterm labor     Past Surgical History:  Procedure Laterality Date   CESAREAN SECTION  09/09/2013   PLACEMENT OF BREAST IMPLANTS Bilateral 2017    No Known Allergies  Prior to Admission medications   Medication Sig Start Date End Date Taking? Authorizing Provider  famotidine (PEPCID) 10 MG tablet Take 10 mg by mouth daily. Prn use   Yes [provider]  Prenatal Vit-Fe Fumarate-FA (PRENATAL MULTIVITAMIN) TABS tablet Take 1 tablet by mouth daily at 12 noon.   Yes [provider]  nitrofurantoin (MACRODANTIN) 100 MG capsule Take 1 capsule (100 mg total) by mouth at bedtime. Patient not taking: Reported on 01/16/2022 12/29/21   Glenna Fellows, FNP     Prenatal care site:  [facility]  OB History  Gravida Para Term Preterm AB Living  3 2 1 1  0 2  SAB IAB Ectopic Multiple Live Births  0 0 0 0 2    # Outcome Date GA Lbr Len/2nd Weight Sex Delivery Anes PTL Lv  3 Current           2 Term 09/09/13 [redacted]w[redacted]d  2495 g F CS-LTranv   LIV     Birth Comments: PROM, breech presentation  1 Preterm 10/16/08 [redacted]w[redacted]d  1928 g M Vag-Spont   LIV    Obstetric Comments  10/16/2008 with SROM and EDC=11/16/2008 (35 wks)     Social History: She  reports that she has never smoked. She has never been exposed to tobacco smoke. She has never used smokeless tobacco. She reports that she does not currently use alcohol. She reports that she does not use drugs.  Family History: family history includes Diabetes in her mother; Healthy in her father.   Review of Systems: A full review of systems was performed and negative except as noted in the HPI.     Physical Exam:  Vital Signs: BP 111/77 (BP Location: Right Arm)   Pulse 75   Temp 98.1 F (36.7 C) (Oral)   Resp 16   Ht  (1.499 m)   Wt 71.7 kg   LMP 04/17/2021 (Exact Date) Comment: light menses  lasting 2 days  BMI 31.91 kg/m   General: no acute distress.  HEENT: normocephalic, atraumatic Heart: regular rate & rhythm Lungs: normal respiratory effort Abdomen: soft, gravid, non-tender;  EFW: 5lbs 12/29/21 Pelvic:   External: Normal external female genitalia  Cervix:   /   /      Extremities: non-tender, symmetric, no edema bilaterally.  DTRs: +2  Neurologic: Alert & oriented x 3.    Results for orders placed or performed during the hospital encounter of 01/16/22 (from the past 24 hour(s))  ROM Plus ([facility] only)     Status: None   Collection Time: 01/16/22  8:16 AM  Result Value Ref Range   Rom Plus POSITIVE     Pertinent Results:  Prenatal Labs: Blood type/Rh O   Antibody screen Negative    Rubella 15.20 (05/11 1620)   Varicella Unknown  RPR Non Reactive (09/08 1625)   HBsAg Negative (05/11 1620)  Hep C NR   HIV NR    GC neg  Chlamydia neg  Genetic screening cfDNA   1 hour GTT 107  3 hour GTT N/a  GBS Positive/-- (11/03 1709)    FHT:  FHR: 140 bpm, variability: moderate,  accelerations:  Present,  decelerations:  Absent Category/reactivity:  Category I UC:   irregular, every 4-6 minutes   Cephalic by Leopolds and SVE   [place] MFM FETAL BPP WO NON STRESS  Result Date: 01/04/2022 ----------------------------------------------------------------------  OBSTETRICS REPORT                       (Signed Final 01/04/2022 04:42 pm) ---------------------------------------------------------------------- Patient Info  ID #:       [id number]                          D.O.B.:  11-03-1985 (36 yrs)  Name:       Courtney Newman               Visit Date: 01/04/2022 03:42 pm ---------------------------------------------------------------------- Performed By  Attending:        Noralee Space MD        Ref. Address:     [address]                                                             [address],  [place], [place]                                                              [address]  Performed By:     Emeline Darling BS,      [facility]                                     [facility]                                                             [facility]  Referred By:      Alberteen Spindle CNM ---------------------------------------------------------------------- Orders  #  Description                           Code        Ordered By  1  [place] MFM FETAL BPP WO NON               76819.01    YU FANG     STRESS ----------------------------------------------------------------------  #  Order #                     Accession #                Episode #  1  [id number]                   [id number]                 [id number] ---------------------------------------------------------------------- Indications  Abnormal fetal ultrasound (right cleft lip and O28.9  palate)  Poor obstetric history: Previous preterm       O09.219  delivery, antepartum ([redacted]w[redacted]d)  History of cesarean delivery, currently        O34.219  pregnant  [redacted] weeks gestation of pregnancy                Z3A.36 ---------------------------------------------------------------------- Vital Signs  BP:          114/73 ---------------------------------------------------------------------- Fetal Evaluation  Num Of Fetuses:         1  Fetal Heart Rate(bpm):  142  Cardiac Activity:       Observed  Presentation:           Cephalic  Placenta:  Anterior  P. Cord Insertion:      Previously Visualized  Amniotic Fluid  AFI FV:      Within normal limits  AFI Sum(cm)     %Tile       Largest Pocket(cm)  9.6             19          3.  RUQ(cm)       RLQ(cm)       LUQ(cm)        LLQ(cm)  3             1.8           2.6            2.2 ---------------------------------------------------------------------- Biophysical Evaluation  Amniotic F.V:   Pocket => 2 cm              F. Tone:        Observed  F. Movement:    Observed                   Score:          8/8  F. Breathing:   Observed ---------------------------------------------------------------------- OB History  Gravidity:    3  Living:       2 ---------------------------------------------------------------------- Gestational Age  LMP:           37w 3d        Date:  04/17/21                  EDD:   01/22/22  Best:          36w 1d     Det. By:  U/S  (08/18/21)          EDD:   01/31/22 ---------------------------------------------------------------------- Anatomy  Stomach:               Appears normal, left   Bladder:                Appears normal                         sided ---------------------------------------------------------------------- Impression  Unilateral (right) cleft lip and palate. Patient had consultation  with plastic surgeon at [facility].  Amniotic fluid is normal good fetal activity seen.  Fetal growth  is appropriate for gestational age.  Right cleft lip and palate is  seen again. The tongue appears in normal position and is not  dispaced upward (less likelihood of airway obstruction at  delivery).  She does not have gestational diabetes. ---------------------------------------------------------------------- Recommendations  -No follow-up appointments were made . ----------------------------------------------------------------------                  Noralee Space, MD Electronically Signed Final Report   01/04/2022 04:42 pm ----------------------------------------------------------------------  [place] MFM OB FOLLOW UP  Result Date: 12/29/2021 ----------------------------------------------------------------------  OBSTETRICS REPORT                       (Signed Final 12/29/2021 01:51 pm) ---------------------------------------------------------------------- Patient Info  ID #:       [id number]                          D.O.B.:  Nov 27, 1985 (36 yrs)  Name:       Courtney Newman               Visit Date:  12/29/2021  12:49 pm ---------------------------------------------------------------------- Performed By  Attending:        Ma Rings MD         Ref. Address:     [address] [place],                                                             [place], [place]                                                             [address]  Performed By:     Burt Knack RDMS     Location[facility]                                                             [facility] at                                                             [facility]  Referred By:      Alberteen Spindle CNM ---------------------------------------------------------------------- Orders  #  Description                           Code        Ordered By  1  [place] MFM OB FOLLOW UP                   21308.65    Noralee Space ----------------------------------------------------------------------  #  Order #                     Accession #                Episode #  1  [id number]                   [id number]                 [id number] ---------------------------------------------------------------------- Indications  Abnormal fetal ultrasound (right cleft lip and O28.9  palate)  Poor obstetric history: Previous preterm       O09.219  delivery, antepartum ([redacted]w[redacted]d)  History of cesarean delivery, currently  O34.219  pregnant  [redacted] weeks gestation of pregnancy                Z3A.35  Encounter for other antenatal screening        Z36.2  follow-up ---------------------------------------------------------------------- Fetal Evaluation  Num Of Fetuses:         1  Fetal Heart Rate(bpm):  144  Cardiac Activity:       Observed  Presentation:           Cephalic  Placenta:               Anterior  P. Cord Insertion:      Previously Visualized  Amniotic Fluid  AFI FV:      Subjectively low-normal  AFI Sum(cm)     %Tile       Largest Pocket(cm)  8.36            9            4.66  RUQ(cm)       RLQ(cm)       LUQ(cm)        LLQ(cm)  2.35          1.35          4.66           0 ---------------------------------------------------------------------- Biometry  BPD:     86.69  mm     G. Age:  35w 0d         45  %    CI:        79.96   %    70 - 86                                                          FL/HC:      20.1   %    20.1 - 22.3  HC:    306.31   mm     G. Age:  34w 1d        4.1  %    HC/AC:      1.01        0.93 - 1.11  AC:    304.21   mm     G. Age:  34w 3d         31  %    FL/BPD:     71.1   %    71 - 87  FL:      61.61  mm     G. Age:  32w 0d        < 1  %    FL/AC:      20.3   %    20 - 24  Est. FW:    2264  gm           5 lb     12  % ---------------------------------------------------------------------- OB History  Gravidity:    3  Living:       2 ---------------------------------------------------------------------- Gestational Age  LMP:           36w 4d        Date:  04/17/21                  EDD:   01/22/22  U/S Today:     33w 6d  EDD:   02/10/22  Best:          35w 2d     Det. By:  U/S  (08/18/21)          EDD:   01/31/22 ---------------------------------------------------------------------- Anatomy  Cranium:               Appears normal         LVOT:                   Previously seen  Cavum:                 Appears normal         Aortic Arch:            Previously seen  Ventricles:            Appears normal         Ductal Arch:            Not well visualized  Choroid Plexus:        Previously seen        Diaphragm:              Previously seen  Cerebellum:            Previously seen        Stomach:                Appears normal, left                                                                        sided  Posterior Fossa:       Previously seen        Abdomen:                Appears normal  Nuchal Fold:           Previously seen        Abdominal Wall:         Previously seen  Face:                  Orbits and profile     Cord  Vessels:           Previously seen                         previously seen  Lips:                  Abnormal, prev         Kidneys:                Appear normal                         seen  Palate:                Abnormal -prev         Bladder:                Appears normal                         seen  Thoracic:  Appears normal         Spine:                  Previously seen  Heart:                 Appears normal         Upper Extremities:      Previously seen                         (4CH, axis, and                         situs)  RVOT:                  Previously seen        Lower Extremities:      Previously seen  Other:  VC, 3VV, 3VTV  Nasal bone, lenses, mandible and falx,  Hands and          feet previously visualized. ---------------------------------------------------------------------- Comments  This patient was seen for a follow up growth scan due to a  fetus with a known cleft lip and possible cleft palate.  The  patient was seen by the craniofacial team at [facility] yesterday to discuss the repair of the cleft lip/palate  after birth.  The overall EFW of 5 pounds measures at the 12th percentile  for her gestational age.  Low normal amniotic fluid with a total AFI of 8.36 cm is noted.  Due to a fetus with cleft lip and palate along with the low  normal EFW, she will return in 1 week for a BPP. ----------------------------------------------------------------------                   Ma Rings, MD Electronically Signed Final Report   12/29/2021 01:51 pm ----------------------------------------------------------------------   Assessment:  Courtney Newman is a 36 y.o. [id number] female at [redacted]w[redacted]d with AMA, elevated AFP, cleft palate/lip, hx of c-section, hx of preterm delivery and GBS pos..   Plan:  1. Admit to Labor & Delivery - consents reviewed and obtained - Dr. Dalbert Garnet notified of admission and plan of care   2. Fetal Well being  - Fetal Tracing: Category 1 - Group B  Streptococcus ppx  indicated: GBS positive - Presentation: cephalic confirmed by SVE   3. Routine OB: - Prenatal labs reviewed, as above - Rh negative - CBC, T&S, RPR on admit - Clear liquid diet , continuous IV fluids  4. Monitoring of labor  - Contractions monitored with external toco - Pelvis proven to 1928g adequate for trial of labor  - Plan for expectant management  - Augmentation with oxytocin as appropriate  - Plan for  continuous fetal monitoring - Maternal pain control as desired; planning IVPM, position changes , birth ball, and unmedicated labor support options  - Anticipate vaginal delivery  5. Post Partum Planning: - Infant feeding:  TBD - Contraception: bilateral tubal ligation - Tdap vaccine: Given prenatally - Flu vaccine: Given prenatally  Judith Campillo Wonda Amis, CNM 01/16/22 9:18 AM  Chari Manning, CNM Certified Nurse Midwife [facility]

## 2022-01-16 NOTE — Progress Notes (Signed)
Courtney Newman is a 36 y.o. G3P1102 at [redacted]w[redacted]d, prior c/s for breech with PROM, now on pitocin  Subjective: Received epidural just now  Objective: BP 132/84 (BP Location: Left Arm)   Pulse 77   Temp 98.4 F (36.9 C) (Oral)   Resp 20   Ht 4\' 11"  (1.499 m)   Wt 71.7 kg   LMP 04/17/2021 (Exact Date) Comment: light menses lasting 2 days  BMI 31.91 kg/m  No intake/output data recorded. No intake/output data recorded.  FHT:  FHR: 145 bpm, variability: moderate,  accelerations:  Present,  decelerations:  Absent UC:   regular, every 4 minutes SVE:   Dilation: 6 Effacement (%): 60 Station: -1 Exam by:: [email or web address], CNM  Labs: Lab Results  Component Value Date   WBC 9.2 01/16/2022   HGB 14.7 01/16/2022   HCT 43.1 01/16/2022   MCV 86.2 01/16/2022   PLT 175 01/16/2022    Assessment / Plan: Augmentation of labor, progressing well  Labor: Progressing normally Fetal Wellbeing:  Category I Pain Control:  Epidural and just placed I/D:   GBS positive Anticipated MOD:  NSVD  13/01/2022, MD 01/16/2022, 9:18 PM

## 2022-01-16 NOTE — Progress Notes (Signed)
Labor Check  Subj:  Complaints: }has no unusual complaints  Obj:  BP 132/84 (BP Location: Left Arm)   Pulse 77   Temp 98.4 F (36.9 C) (Oral)   Resp 20   Ht 4\' 11"  (1.499 m)   Wt 71.7 kg   LMP 04/17/2021 (Exact Date) Comment: light menses lasting 2 days  BMI 31.91 kg/m  Dose (milli-units/min) Oxytocin: 18 milli-units/min  Cervix: Dilation: 6 / Effacement (%): 60 / Station: -1  Baseline 06/15/2021: 135 bpm, Variability: Good {> 6 bpm), Accelerations: Reactive, and Decelerations: Absent Contractions: regular, every 2-5 minutes Overall assessment: reassuring    A/P: 36 y.o. 30 female at [redacted]w[redacted]d with TOLAC  1.  Labor: Early latent labor. Labor Progressing normally and will start Pitocin for augmentation and anticipate  NSVD. 2.  [redacted]w[redacted]d assessment: Category I 3.  Group B Strep positive 4. Membranes ruptured, clear fluid 5.  Pain: level of pain (1-10, 10 severe), 5 6.  Recheck:Evaluated by digital exam. 7. Continue present management.  OKH:[id number] CNM

## 2022-01-17 ENCOUNTER — Encounter
Admission: EM | Disposition: A | Discharge status: Home / Self Care | Payer: Self-pay | Source: Home / Self Care | Attending: Obstetrics

## 2022-01-17 ENCOUNTER — Inpatient Hospital Stay: Payer: Medicaid Other | Admitting: Anesthesiology

## 2022-01-17 ENCOUNTER — Other Ambulatory Visit: Payer: Self-pay

## 2022-01-17 ENCOUNTER — Encounter: Payer: Self-pay | Admitting: Obstetrics and Gynecology

## 2022-01-17 DIAGNOSIS — O0993 Supervision of high risk pregnancy, unspecified, third trimester: Secondary | ICD-10-CM | POA: Diagnosis not present

## 2022-01-17 HISTORY — PX: TUBAL LIGATION: SHX77

## 2022-01-17 LAB — CBC
HCT: 32.6 % — ABNORMAL LOW (ref 36.0–46.0)
Hemoglobin: 11.3 g/dL — ABNORMAL LOW (ref 12.0–15.0)
MCH: 29.7 pg (ref 26.0–34.0)
MCHC: 34.7 g/dL (ref 30.0–36.0)
MCV: 85.8 fL (ref 80.0–100.0)
Platelets: 143 10*3/uL — ABNORMAL LOW (ref 150–400)
RBC: 3.8 MIL/uL — ABNORMAL LOW (ref 3.87–5.11)
RDW: 14.2 % (ref 11.5–15.5)
WBC: 12.6 10*3/uL — ABNORMAL HIGH (ref 4.0–10.5)
nRBC: 0 % (ref 0.0–0.2)

## 2022-01-17 LAB — RPR: RPR Ser Ql: NONREACTIVE

## 2022-01-17 SURGERY — LIGATION, FALLOPIAN TUBE, POSTPARTUM
Anesthesia: General

## 2022-01-17 MED ORDER — FENTANYL CITRATE (PF) 100 MCG/2ML IJ SOLN
INTRAMUSCULAR | Status: DC | PRN
  Administered 2022-01-17: 100 ug via INTRAVENOUS

## 2022-01-17 MED ORDER — LIDOCAINE HCL (CARDIAC) PF 100 MG/5ML IV SOSY
PREFILLED_SYRINGE | INTRAVENOUS | Status: DC | PRN
  Administered 2022-01-17: 100 mg via INTRAVENOUS

## 2022-01-17 MED ORDER — SUGAMMADEX SODIUM 200 MG/2ML IV SOLN
INTRAVENOUS | Status: DC | PRN
  Administered 2022-01-17: 200 mg via INTRAVENOUS

## 2022-01-17 MED ORDER — SODIUM CHLORIDE 0.9% FLUSH
3.0000 mL | Freq: Two times a day (BID) | INTRAVENOUS | Status: DC
  Administered 2022-01-17: 3 mL via INTRAVENOUS

## 2022-01-17 MED ORDER — PRENATAL MULTIVITAMIN CH
1.0000 | ORAL_TABLET | Freq: Every day | ORAL | Status: DC
  Administered 2022-01-17 – 2022-01-18 (×2): 1 via ORAL
  Filled 2022-01-17 (×2): qty 1

## 2022-01-17 MED ORDER — LACTATED RINGERS IV SOLN: INTRAVENOUS | Status: DC

## 2022-01-17 MED ORDER — SODIUM CHLORIDE 0.9 % IV SOLN: 250.0000 mL | INTRAVENOUS | Status: DC | PRN

## 2022-01-17 MED ORDER — ROCURONIUM BROMIDE 10 MG/ML (PF) SYRINGE
PREFILLED_SYRINGE | INTRAVENOUS | Status: AC
  Filled 2022-01-17: qty 10

## 2022-01-17 MED ORDER — METOCLOPRAMIDE HCL 10 MG PO TABS
10.0000 mg | ORAL_TABLET | Freq: Once | ORAL | Status: AC
  Administered 2022-01-17: 10 mg via ORAL
  Filled 2022-01-17: qty 1

## 2022-01-17 MED ORDER — COCONUT OIL OIL: 1.0000 | TOPICAL_OIL | Status: DC | PRN

## 2022-01-17 MED ORDER — ONDANSETRON HCL 4 MG/2ML IJ SOLN
INTRAMUSCULAR | Status: DC | PRN
  Administered 2022-01-17: 4 mg via INTRAVENOUS

## 2022-01-17 MED ORDER — ACETAMINOPHEN 325 MG PO TABS: 650.0000 mg | ORAL_TABLET | ORAL | Status: DC | PRN

## 2022-01-17 MED ORDER — OXYCODONE HCL 5 MG PO TABS: 5.0000 mg | ORAL_TABLET | Freq: Once | ORAL | Status: DC | PRN

## 2022-01-17 MED ORDER — PROPOFOL 10 MG/ML IV BOLUS
INTRAVENOUS | Status: AC
  Filled 2022-01-17: qty 20

## 2022-01-17 MED ORDER — DIPHENHYDRAMINE HCL 25 MG PO CAPS: 25.0000 mg | ORAL_CAPSULE | Freq: Four times a day (QID) | ORAL | Status: DC | PRN

## 2022-01-17 MED ORDER — METHYLERGONOVINE MALEATE 0.2 MG/ML IJ SOLN
INTRAMUSCULAR | Status: AC
  Filled 2022-01-17: qty 1

## 2022-01-17 MED ORDER — ONDANSETRON HCL 4 MG/2ML IJ SOLN: 4.0000 mg | INTRAMUSCULAR | Status: DC | PRN

## 2022-01-17 MED ORDER — OXYCODONE HCL 5 MG PO TABS
5.0000 mg | ORAL_TABLET | ORAL | Status: DC | PRN
  Administered 2022-01-17: 5 mg via ORAL
  Filled 2022-01-17: qty 1

## 2022-01-17 MED ORDER — IBUPROFEN 600 MG PO TABS
600.0000 mg | ORAL_TABLET | Freq: Four times a day (QID) | ORAL | Status: DC
  Administered 2022-01-17 – 2022-01-18 (×5): 600 mg via ORAL
  Filled 2022-01-17 (×5): qty 1

## 2022-01-17 MED ORDER — FENTANYL CITRATE (PF) 100 MCG/2ML IJ SOLN
INTRAMUSCULAR | Status: AC
  Filled 2022-01-17: qty 2

## 2022-01-17 MED ORDER — LACTATED RINGERS IV SOLN: INTRAVENOUS | Status: DC | PRN

## 2022-01-17 MED ORDER — ZOLPIDEM TARTRATE 5 MG PO TABS: 5.0000 mg | ORAL_TABLET | Freq: Every evening | ORAL | Status: DC | PRN

## 2022-01-17 MED ORDER — CEFAZOLIN SODIUM-DEXTROSE 2-3 GM-%(50ML) IV SOLR
INTRAVENOUS | Status: DC | PRN
  Administered 2022-01-17: 2 g via INTRAVENOUS

## 2022-01-17 MED ORDER — ONDANSETRON HCL 4 MG PO TABS: 4.0000 mg | ORAL_TABLET | ORAL | Status: DC | PRN

## 2022-01-17 MED ORDER — ROCURONIUM BROMIDE 100 MG/10ML IV SOLN
INTRAVENOUS | Status: DC | PRN
  Administered 2022-01-17: 20 mg via INTRAVENOUS
  Administered 2022-01-17: 50 mg via INTRAVENOUS

## 2022-01-17 MED ORDER — FAMOTIDINE 20 MG PO TABS
40.0000 mg | ORAL_TABLET | Freq: Once | ORAL | Status: AC
  Administered 2022-01-17: 40 mg via ORAL
  Filled 2022-01-17: qty 2

## 2022-01-17 MED ORDER — WITCH HAZEL-GLYCERIN EX PADS
1.0000 | MEDICATED_PAD | CUTANEOUS | Status: DC | PRN
  Filled 2022-01-17: qty 100

## 2022-01-17 MED ORDER — DEXAMETHASONE SODIUM PHOSPHATE 10 MG/ML IJ SOLN
INTRAMUSCULAR | Status: AC
  Filled 2022-01-17: qty 1

## 2022-01-17 MED ORDER — MISOPROSTOL 200 MCG PO TABS
800.0000 ug | ORAL_TABLET | Freq: Once | ORAL | Status: AC
  Administered 2022-01-17: 800 ug via RECTAL

## 2022-01-17 MED ORDER — FENTANYL CITRATE (PF) 100 MCG/2ML IJ SOLN: 25.0000 ug | INTRAMUSCULAR | Status: DC | PRN

## 2022-01-17 MED ORDER — BENZOCAINE-MENTHOL 20-0.5 % EX AERO
1.0000 | INHALATION_SPRAY | CUTANEOUS | Status: DC | PRN
  Filled 2022-01-17 (×2): qty 56

## 2022-01-17 MED ORDER — DEXAMETHASONE SODIUM PHOSPHATE 10 MG/ML IJ SOLN
INTRAMUSCULAR | Status: DC | PRN
  Administered 2022-01-17: 10 mg via INTRAVENOUS

## 2022-01-17 MED ORDER — DEXMEDETOMIDINE HCL IN NACL 200 MCG/50ML IV SOLN
INTRAVENOUS | Status: DC | PRN
  Administered 2022-01-17: 8 ug via INTRAVENOUS
  Administered 2022-01-17: 4 ug via INTRAVENOUS

## 2022-01-17 MED ORDER — ONDANSETRON HCL 4 MG/2ML IJ SOLN
INTRAMUSCULAR | Status: AC
  Filled 2022-01-17: qty 2

## 2022-01-17 MED ORDER — TETANUS-DIPHTH-ACELL PERTUSSIS 5-2.5-18.5 LF-MCG/0.5 IM SUSY: 0.5000 mL | PREFILLED_SYRINGE | Freq: Once | INTRAMUSCULAR | Status: DC

## 2022-01-17 MED ORDER — MEASLES, MUMPS & RUBELLA VAC IJ SOLR: 0.5000 mL | Freq: Once | INTRAMUSCULAR | Status: DC

## 2022-01-17 MED ORDER — FLEET ENEMA 7-19 GM/118ML RE ENEM: 1.0000 | ENEMA | Freq: Every day | RECTAL | Status: DC | PRN

## 2022-01-17 MED ORDER — SODIUM CHLORIDE 0.9% FLUSH: 3.0000 mL | INTRAVENOUS | Status: DC | PRN

## 2022-01-17 MED ORDER — SIMETHICONE 80 MG PO CHEW: 80.0000 mg | CHEWABLE_TABLET | ORAL | Status: DC | PRN

## 2022-01-17 MED ORDER — OXYCODONE HCL 5 MG/5ML PO SOLN: 5.0000 mg | Freq: Once | ORAL | Status: DC | PRN

## 2022-01-17 MED ORDER — DIBUCAINE (PERIANAL) 1 % EX OINT
1.0000 | TOPICAL_OINTMENT | CUTANEOUS | Status: DC | PRN
  Filled 2022-01-17: qty 28

## 2022-01-17 MED ORDER — 0.9 % SODIUM CHLORIDE (POUR BTL) OPTIME
TOPICAL | Status: DC | PRN
  Administered 2022-01-17: 1000 mL

## 2022-01-17 MED ORDER — KETOROLAC TROMETHAMINE 30 MG/ML IJ SOLN
INTRAMUSCULAR | Status: DC | PRN
  Administered 2022-01-17: 30 mg via INTRAVENOUS

## 2022-01-17 MED ORDER — SENNOSIDES-DOCUSATE SODIUM 8.6-50 MG PO TABS
2.0000 | ORAL_TABLET | ORAL | Status: DC
  Administered 2022-01-18: 2 via ORAL
  Filled 2022-01-17 (×3): qty 2

## 2022-01-17 MED ORDER — PROPOFOL 10 MG/ML IV BOLUS
INTRAVENOUS | Status: DC | PRN
  Administered 2022-01-17: 150 mg via INTRAVENOUS

## 2022-01-17 MED ORDER — BISACODYL 10 MG RE SUPP: 10.0000 mg | Freq: Every day | RECTAL | Status: DC | PRN

## 2022-01-17 SURGICAL SUPPLY — 38 items
APPLICATOR ARISTA FLEXITIP XL (MISCELLANEOUS) IMPLANT
BLADE SURG SZ11 CARB STEEL (BLADE) ×1 IMPLANT
CHLORAPREP W/TINT 26 (MISCELLANEOUS) ×1 IMPLANT
DERMABOND ADVANCED .7 DNX12 (GAUZE/BANDAGES/DRESSINGS) ×1 IMPLANT
DRAPE LAPAROTOMY 100X77 ABD (DRAPES) ×1 IMPLANT
ELECT REM PT RETURN 9FT ADLT (ELECTROSURGICAL) ×1
ELECTRODE REM PT RTRN 9FT ADLT (ELECTROSURGICAL) ×1 IMPLANT
GAUZE 4X4 16PLY ~~LOC~~+RFID DBL (SPONGE) ×1 IMPLANT
GLOVE BIO SURGEON STRL SZ7 (GLOVE) ×1 IMPLANT
GLOVE SURG UNDER LTX SZ7.5 (GLOVE) ×1 IMPLANT
GOWN STRL REUS W/ TWL LRG LVL3 (GOWN DISPOSABLE) ×1 IMPLANT
GOWN STRL REUS W/TWL LRG LVL3 (GOWN DISPOSABLE) ×1
HEMOSTAT ARISTA ABSORB 1G (HEMOSTASIS) IMPLANT
KIT TURNOVER CYSTO (KITS) ×1 IMPLANT
LABEL OR SOLS (LABEL) ×1 IMPLANT
LIGASURE IMPACT 36 18CM CVD LR (INSTRUMENTS) IMPLANT
LIGASURE LAP MARYLAND 5MM 37CM (ELECTROSURGICAL) IMPLANT
MANIFOLD NEPTUNE II (INSTRUMENTS) ×1 IMPLANT
NEEDLE HYPO 22GX1.5 SAFETY (NEEDLE) ×1 IMPLANT
NS IRRIG 500ML POUR BTL (IV SOLUTION) ×1 IMPLANT
PACK BASIN MINOR ARMC (MISCELLANEOUS) ×1 IMPLANT
PAD OB MATERNITY 4.3X12.25 (PERSONAL CARE ITEMS) ×1 IMPLANT
RETRACTOR RING XSMALL (MISCELLANEOUS) ×1 IMPLANT
RTRCTR WOUND ALEXIS 13CM XS SH (MISCELLANEOUS) ×1
SCRUB CHG 4% DYNA-HEX 4OZ (MISCELLANEOUS) ×1 IMPLANT
SPONGE T-LAP 4X18 ~~LOC~~+RFID (SPONGE) ×1 IMPLANT
SUT CHROMIC GUT BROWN 0 54 (SUTURE) ×1 IMPLANT
SUT CHROMIC GUT BROWN 0 54IN (SUTURE) ×1
SUT MNCRL 4-0 (SUTURE) ×1
SUT MNCRL 4-0 27XMFL (SUTURE) ×1
SUT VIC AB 0 CT2 27 (SUTURE) ×1 IMPLANT
SUT VICRYL 0 UR6 27IN ABS (SUTURE) ×2 IMPLANT
SUTURE MNCRL 4-0 27XMF (SUTURE) ×1 IMPLANT
SYR 10ML LL (SYRINGE) ×1 IMPLANT
SYS TROCAR 1.5-3 SLV ABD GEL (ENDOMECHANICALS) ×1
SYSTEM TROCR 1.5-3 SLV ABD GEL (ENDOMECHANICALS) IMPLANT
TRAP FLUID SMOKE EVACUATOR (MISCELLANEOUS) ×1 IMPLANT
WATER STERILE IRR 500ML POUR (IV SOLUTION) ×1 IMPLANT

## 2022-01-17 NOTE — Transfer of Care (Signed)
Immediate Anesthesia Transfer of Care Note  Patient: Courtney Newman  Procedure(s) Performed: POST PARTUM TUBAL LIGATION CONVERSION TO LAPAROSCOPY  Patient Location: PACU  Anesthesia Type:General  Level of Consciousness: drowsy and patient cooperative  Airway & Oxygen Therapy: Patient Spontanous Breathing and Patient connected to face mask oxygen  Post-op Assessment: Report given to RN and Post -op Vital signs reviewed and stable  Post vital signs: Reviewed and stable  Last Vitals:  Vitals Value Taken Time  BP 125/77 01/17/22 1150  Temp 36.9 C 01/17/22 1150  Pulse 69 01/17/22 1155  Resp 13 01/17/22 1155  SpO2 98 % 01/17/22 1155  Vitals shown include unvalidated device data.  Last Pain:  Vitals:   01/17/22 0738  TempSrc: Oral  PainSc: 0-No pain      Patients Stated Pain Goal: 0 (01/16/22 1800)  Complications: No notable events documented.

## 2022-01-17 NOTE — Op Note (Addendum)
Courtney Newman 01/17/2022  PREOPERATIVE DIAGNOSES: Multiparity, undesired fertility  POSTOPERATIVE DIAGNOSES: Multiparity, undesired fertility  PROCEDURE:  Postpartum Bilateral Tubal Sterilization by modified [place] method attempted, and converted to laparoscopy  Modifer 22 for the significant complexity and possible morbidity of this procedure.  SURGEON: Dr. Christeen Douglas with Chari Manning, CNM assisting due to complexity of the case  ANESTHESIA:  Epidural and local analgesia using 10 ml of 0.5% Marcaine  Anesthesiologist: No responsible provider has been recorded for the case. Anesthesiologist: Piscitello, Cleda Mccreedy, MD CRNA: Ginger Carne, CRNA; Omer Jack, CRNA  COMPLICATIONS: No complications from patient's safety standpoint, but adhesions in the abdomen precluded this case being performed by mini laparotomy.  Conversion to single site laparoscopy.  ESTIMATED BLOOD LOSS: 25 ml.  FLUIDS: 700 ml LR.  URINE OUTPUT:  not assessed  INDICATIONS:  36 y.o. D3U2025 with undesired fertility, status post vaginal delivery, desires permanent sterilization.  Other reversible forms of contraception were discussed with patient; she declines all other modalities. Risks of procedure discussed with patient including but not limited to: risk of regret, permanence of method, bleeding, infection, injury to surrounding organs and need for additional procedures.  Failure risk of 1 -2 % with increased risk of ectopic gestation if pregnancy occurs was also discussed with patient.      FINDINGS: Significant uterine bowel and omental adhesions throughout the abdomen.  Pelvis not able to be assessed even with laparoscopy.  Left-sided adhesions in the left paracolic gutter between the bowel, omentum, and adnexa with a small portion of the left tubal fimbria visible.  The left ovary and left fallopian tube were out of the pelvis and in the left paracolic gutter.  On the right, the large  bowel was adherent to the right pelvic sidewall.  A normal appendix was visualized.  The tube on this side was normal, but tucked behind the bowel.  PROCEDURE DETAILS: The patient was taken to the operating room where she had GETA induced and an OG tube placed.  She was then placed in the dorsal supine position and prepped and draped in sterile fashion.  After an adequate timeout was performed, attention was turned to the patient's abdomen where a small transverse skin incision was made under the umbilical fold. The incision was taken down to the layer of fascia using the scalpel, and fascia was incised, and extended bilaterally using Mayo scissors. The peritoneum was entered in a sharp fashion. Attention was then turned to the patient's uterus, and right fallopian tube was identified and followed out to the fimbriated end. Using a handheld ligasure, the fimbria was cauterized and excised, with care given to incorporate the underlying mesosalpinx.  A similar process was attempted to be carried out on the right side, but the tube was unable to be found.  The mini laparotomy was extended to allow for better visualization, and adhesions between the omentum and bowel were noted.  The decision was made to convert to laparoscopy for better visualization.  A small GelPort was placed in the mini laparotomy.  Visualization was much improved with 15 mmHg, but the adhesions above were noted.  2 other ports were placed at the same site, and examination found the fimbriated of the left tube.  This was removed carefully, using a laparoscopic LigaSure.  No bleeding at this site was noted, although bleeding from a nearby bowel adhesion was controlled with Arista.  Both surgical sites, the left and the right adnexa, were reviewed and noted to be hemostatic.  The  port was removed, and bleeding was noted at the left rectus muscle lateral to the mini laparotomy.  A figure-of-eight was placed in the muscle at the site, and then  the fascial incision was repaired with 0 Vicryl, and the skin was closed with a 4-0 Vicryl subcuticular stitch. The patient tolerated the procedure well. Dermabond and a pressure dressing placed. Instrument, sponge, and needle counts were correct times two.  The patient was then taken to the recovery room awake and in stable condition.  I am not certain that enough of the tube was removed on the right for adequate sterilization.  On ectopic pregnancy would be very difficult to manage without an open incision given her adhesions, and I will discuss alternative contraception for her at her postop visit.

## 2022-01-17 NOTE — Anesthesia Preprocedure Evaluation (Addendum)
Anesthesia Evaluation  Patient identified by MRN, date of birth, ID band Patient awake    Reviewed: Allergy & Precautions, NPO status , Patient's Chart, lab work & pertinent test results  Airway Mallampati: III  TM Distance: <3 FB Neck ROM: full    Dental  (+) Chipped   Pulmonary neg pulmonary ROS, neg shortness of breath   Pulmonary exam normal        Cardiovascular Exercise Tolerance: Good (-) angina (-) DOE negative cardio ROS Normal cardiovascular exam     Neuro/Psych negative neurological ROS  negative psych ROS   GI/Hepatic negative GI ROS, Neg liver ROS,neg GERD  ,,  Endo/Other  negative endocrine ROS    Renal/GU      Musculoskeletal   Abdominal   Peds  Hematology negative hematology ROS (+)   Anesthesia Other Findings Past Medical History: 06/2021: Ear infection     Comment:  tx with Amoxicillin No date: Preterm labor  Past Surgical History: 09/09/2013: CESAREAN SECTION 2017: PLACEMENT OF BREAST IMPLANTS; Bilateral  BMI    Body Mass Index: 31.91 kg/m      Reproductive/Obstetrics (+) Breast feeding                              Anesthesia Physical Anesthesia Plan  ASA: 2  Anesthesia Plan: General ETT   Post-op Pain Management:    Induction: Intravenous  PONV Risk Score and Plan: Ondansetron, Dexamethasone, Midazolam and Treatment may vary due to age or medical condition  Airway Management Planned: Oral ETT  Additional Equipment:   Intra-op Plan:   Post-operative Plan: Extubation in OR  Informed Consent: I have reviewed the patients History and Physical, chart, labs and discussed the procedure including the risks, benefits and alternatives for the proposed anesthesia with the patient or authorized representative who has indicated his/her understanding and acceptance.     Dental Advisory Given and Interpreter used for interveiw  Plan Discussed with:  Anesthesiologist, CRNA and Surgeon  Anesthesia Plan Comments: (Patient consented for risks of anesthesia including but not limited to:  - adverse reactions to medications - damage to eyes, teeth, lips or other oral mucosa - nerve damage due to positioning  - sore throat or hoarseness - Damage to heart, brain, nerves, lungs, other parts of body or loss of life  Patient voiced understanding.)       Anesthesia Quick Evaluation

## 2022-01-17 NOTE — Anesthesia Postprocedure Evaluation (Signed)
Anesthesia Post Note  Patient: Chief Operating Officer  Procedure(s) Performed: AN AD HOC LABOR EPIDURAL  Patient location during evaluation: Mother Baby Anesthesia Type: Epidural Level of consciousness: awake and alert Pain management: pain level controlled Vital Signs Assessment: post-procedure vital signs reviewed and stable Respiratory status: spontaneous breathing, nonlabored ventilation and respiratory function stable Cardiovascular status: stable Postop Assessment: no headache, no backache, patient able to bend at knees and able to ambulate Anesthetic complications: no   No notable events documented.   Last Vitals:  Vitals:   01/17/22 0446 01/17/22 0600  BP: 121/68   Pulse: 71   Resp: 16   Temp: 37.9 C 37.1 C  SpO2: 98%     Last Pain:  Vitals:   01/17/22 0600  TempSrc: Oral  PainSc:                  Cleda Mccreedy Ashante Yellin

## 2022-01-17 NOTE — Anesthesia Procedure Notes (Addendum)
Procedure Name: Intubation Date/Time: 01/17/2022 9:36 AM  Performed by: Ginger Carne, CRNAPre-anesthesia Checklist: Patient identified, Patient being monitored, Timeout performed, Emergency Drugs available and Suction available Patient Re-evaluated:Patient Re-evaluated prior to induction Oxygen Delivery Method: Circle system utilized Preoxygenation: Pre-oxygenation with 100% oxygen Induction Type: IV induction Ventilation: Mask ventilation without difficulty Laryngoscope Size: McGraph and 3 Grade View: Grade I Tube type: Oral Tube size: 6.5 mm Number of attempts: 1 Airway Equipment and Method: Stylet Placement Confirmation: ETT inserted through vocal cords under direct vision, positive ETCO2 and breath sounds checked- equal and bilateral Secured at: 21 cm Tube secured with: Tape Dental Injury: Teeth and Oropharynx as per pre-operative assessment

## 2022-01-17 NOTE — Progress Notes (Signed)
Pt wheeled to OR with orderly.

## 2022-01-17 NOTE — Discharge Instructions (Signed)
      Laparoscopic Tubal Removal for Ectopic Pregnancy Discharge Instructions  Laparoscopic tubal removal is a procedure that removes the fallopian tube containing the ectopic pregnancy.  For the next three days, take ibuprofen and acetaminophen on a schedule, every 8 hours. You can take them together or you can intersperse them, and take one every four hours. I also gave you gabapentin for nighttime, to help you sleep and also to control pain. Take gabapentin medicines at night for at least the next 3 nights. You also have a narcotic, oxycodone, to take as needed if the above medicines don't help.  Postop constipation is a major cause of pain. Stay well hydrated, walk as you tolerate, and take over the counter senna as well as stool softeners if you need them.  RISKS AND COMPLICATIONS  Infection. Bleeding. Injury to surrounding organs. Anesthetic side effects. Failure of the procedure. Risks of future ectopic pregnancy on the other side PROCEDURE  You may be given a medicine to help you relax (sedative) before the procedure. You will be given a medicine to make you sleep (general anesthetic) during the procedure. A tube will be put down your throat to help your breath while under general anesthesia. Two small cuts (incisions) are made in the lower abdominal area and one incision is made near the belly button. Your abdominal area will be inflated with a safe gas (carbon dioxide). This helps give the surgeon room to operate, visualize, and helps the surgeon avoid other organs. A thin, lighted tube (laparoscope) with a camera attached is inserted into your abdomen through the incision near the belly button. Other small instruments are also inserted through the other abdominal incisions. The fallopian tube is located and are removed. After the fallopian tube is removed, the gas is released from the abdomen. The incisions will be closed with stitches (sutures), and Dermabond. A bandage may be  placed over the incisions. AFTER THE PROCEDURE  You will also have some mild abdominal discomfort for 3-7 days. You will be given pain medicine to ease any discomfort. As long as there are no problems, you may be allowed to go home. Someone will need to drive you home and be with you for at least 24 hours once home. You may have some mild discomfort in the throat. This is from the tube placed in your throat while you were sleeping. You may experience discomfort in the shoulder area from some trapped air between the liver and diaphragm. This sensation is normal and will slowly go away on its own. HOME CARE INSTRUCTIONS  Take all medicines as directed. Only take over-the-counter or prescription medicines for pain, discomfort, or fever as directed by your caregiver. Resume daily activities as directed. Showers are preferred over baths. You may resume sexual activities in 1 week or as directed. Do not drive while taking narcotics. SEEK MEDICAL CARE IF: . There is increasing abdominal pain. You feel lightheaded or faint. You have the chills. You have an oral temperature above 102 F (38.9 C). There is pus-like (purulent) drainage from any of the wounds. You are unable to pass gas or have a bowel movement. You feel sick to your stomach (nauseous) or throw up (vomit). MAKE SURE YOU:  Understand these instructions. Will watch your condition. Will get help right away if you are not doing well or get worse.  ExitCare Patient Information 2013 [employer].    

## 2022-01-17 NOTE — Anesthesia Postprocedure Evaluation (Signed)
Anesthesia Post Note  Patient: Chief Operating Officer  Procedure(s) Performed: POST PARTUM TUBAL LIGATION CONVERSION TO LAPAROSCOPY  Patient location during evaluation: PACU Anesthesia Type: General Level of consciousness: awake and alert Pain management: pain level controlled Vital Signs Assessment: post-procedure vital signs reviewed and stable Respiratory status: spontaneous breathing, nonlabored ventilation, respiratory function stable and patient connected to nasal cannula oxygen Cardiovascular status: blood pressure returned to baseline and stable Postop Assessment: no apparent nausea or vomiting Anesthetic complications: no   No notable events documented.   Last Vitals:  Vitals:   01/17/22 1228 01/17/22 1237  BP: 122/82 131/83  Pulse:  70  Resp:  20  Temp:  36.9 C  SpO2:  93%    Last Pain:  Vitals:   01/17/22 1237  TempSrc:   PainSc: 2                  Cleda Mccreedy Tanisia Yokley

## 2022-01-18 ENCOUNTER — Encounter: Payer: Self-pay | Admitting: Obstetrics and Gynecology

## 2022-01-18 MED ORDER — ACETAMINOPHEN 325 MG PO TABS: 650.0000 mg | ORAL_TABLET | ORAL | Status: AC | PRN

## 2022-01-18 MED ORDER — COCONUT OIL OIL: 1.0000 | TOPICAL_OIL | 0 refills | Status: AC | PRN

## 2022-01-18 MED ORDER — WITCH HAZEL-GLYCERIN EX PADS: 1.0000 | MEDICATED_PAD | CUTANEOUS | 12 refills | Status: AC | PRN

## 2022-01-18 MED ORDER — IBUPROFEN 600 MG PO TABS: 600.0000 mg | ORAL_TABLET | Freq: Four times a day (QID) | ORAL | 0 refills | Status: AC

## 2022-01-18 MED ORDER — SIMETHICONE 80 MG PO CHEW: 80.0000 mg | CHEWABLE_TABLET | ORAL | 0 refills | Status: AC | PRN

## 2022-01-18 MED ORDER — SENNOSIDES-DOCUSATE SODIUM 8.6-50 MG PO TABS: 2.0000 | ORAL_TABLET | ORAL | 0 refills | Status: AC

## 2022-01-18 MED ORDER — BENZOCAINE-MENTHOL 20-0.5 % EX AERO: 1.0000 | INHALATION_SPRAY | CUTANEOUS | Status: AC | PRN

## 2022-01-18 NOTE — Discharge Summary (Signed)
Obstetrical Discharge Summary  Patient Name: Courtney Newman DOB: 37-Oct-1987 MRN: [id number]  Date of Admission: 01/16/2022 Date of Delivery: 01/17/22 Delivered by: Dalbert Garnet MD Date of Discharge: 01/18/2022  Primary OB: [facility] [id number] last menstrual period was 04/17/2021 (exact date). EDC Estimated Date of Delivery: 01/31/22 Gestational Age at Delivery: [redacted]w[redacted]d   Antepartum complications:  Elevated AFP Fetal cleft lip/palate  History of preterm birth  GBS pos  Previous c/s x 1  AMA  Admitting Diagnosis: Leakage of amniotic fluid [O42.90] Normal labor and delivery [O80]  Secondary Diagnosis: Patient Active Problem List   Diagnosis Date Noted   Leakage of amniotic fluid 01/16/2022   Normal labor and delivery 01/16/2022   Uterine contractions 01/12/2022   Back pain affecting pregnancy 01/12/2022   Acute sinus infection 11/04/2021   large right cleft lip with possible palate involvement on 09/22/21 u/s 09/22/2021   Carrier of spinal muscular atrophy 09/22/2021   UTI #3 09/11/21 GBS 09/14/2021   Epigastric pain 09/11/2021   Abnormal AFP3 test with 1:230 chance of OSB 08/18/21 09/11/2021   UTI (urinary tract infection) 07/16/21 GBS 09/11/2021   UTI #2 GBS 08/14/21 09/11/2021   Supervision of high risk pregnancy in first trimester 07/16/2021   History of preterm delivery at 35 wks 10/16/2008 4#4 07/16/2021   H/O cesarean section 09/09/13 07/16/2021   Low birth weight infants x2 (5#8, 4#4) 07/16/2021   short stature 4'11" 07/16/2021   Hx of breast augmentation 2017 [place] 07/16/2021   Hx of tummy tuck 2017 [place] 07/16/2021   Advanced maternal age in multigravida 36 yo 07/16/2021    Discharge Diagnosis: Term Pregnancy Delivered      Augmentation: Pitocin Complications: None Intrapartum complications/course: see delivery note, augmentation for PROM, 40 sec shoulder dystocia per delivery records.  Delivery Type: vaginal birth after cesarean (VBAC) Anesthesia: epidural  anesthesia Placenta: spontaneous To Pathology: No  Laceration: none Episiotomy: none Newborn Data: Live born female  Birth Weight: 5 lb 11 oz (2580 g) APGAR: 7, 9  Newborn Delivery   Birth date/time: 01/17/2022 00:21:00 Delivery type: Vaginal, Spontaneous      Postpartum Procedures: P.P. tubal ligation Edinburgh:     01/17/2022    1:45 PM  Edinburgh Postnatal Depression Scale Screening Tool  I have been able to laugh and see the funny side of things. 1  I have looked forward with enjoyment to things. 1  I have blamed myself unnecessarily when things went wrong. 2  I have been anxious or worried for no good reason. 1  I have felt scared or panicky for no good reason. 0  Things have been getting on top of me. 2  I have been so unhappy that I have had difficulty sleeping. 1  I have felt sad or miserable. 1  I have been so unhappy that I have been crying. 1  The thought of harming myself has occurred to me. 0  Edinburgh Postnatal Depression Scale Total 10     Post partum course:  Patient had an uncomplicated postpartum course. She had an attempted BTL performed by Dr Dalbert Garnet on PPD0, see Op notes for details.   By time of discharge on PPD#1, her pain was controlled on oral pain medications; she had appropriate lochia and was ambulating, voiding without difficulty and tolerating regular diet.  She was deemed stable for discharge to home.       Discharge Physical Exam:  BP 121/81 (BP Location: Left Arm)   Pulse (!) 55   Temp 97.8 F (36.6 C) (  Oral)   Resp 18   Ht 4\' 11"  (1.499 m)   Wt 71.7 kg   LMP 04/17/2021 (Exact Date) Comment: light menses lasting 2 days  SpO2 100%   Breastfeeding Unknown   BMI 31.91 kg/m   General: NAD CV: RRR Pulm: CTABL, nl effort ABD: s/nd/nt, fundus firm and below the umbilicus Lochia: moderate Perineum:minimal edema/intact Incision: periumbilical incision covered with occlusive OP site dressing  DVT Evaluation: LE non-ttp, no evidence of  DVT on exam.  Hemoglobin  Date Value Ref Range Status  01/17/2022 11.3 (L) 12.0 - 15.0 g/dL Final  13/02/2022 40/98/1191 11.1 - 15.9 g/dL Final   HCT  Date Value Ref Range Status  01/17/2022 32.6 (L) 36.0 - 46.0 % Final   Hematocrit  Date Value Ref Range Status  07/16/2021 40.0 34.0 - 46.6 % Final    Risk assessment for postpartum VTE and prophylactic treatment: Very high risk factors: None High risk factors: None Moderate risk factors: None  Postpartum VTE prophylaxis with LMWH not indicated  Disposition: stable, discharge to home. Baby Feeding: breast and formula feeding Baby Disposition: home with mom  Rh Immune globulin indicated: No Rubella vaccine given: was not indicated Varivax vaccine given: was not indicated Flu vaccine given in AP setting: Yes  Tdap vaccine given in AP setting: Yes   Contraception: bilateral tubal ligation, needs to see BEB postop, backup method needed.   Prenatal Labs:  Blood type/Rh O  Positive  Antibody screen Negative    Rubella immune  Varicella Immune  RPR Non Reactive (09/08 1625)   HBsAg Negative (05/11 1620)  Hep C NR   HIV NR    GC neg  Chlamydia neg  Genetic screening cfDNA neg; elevated AFP 08/2021 but repeat Neg 09/2021  1 hour GTT 107  3 hour GTT N/a  GBS Positive/-- (11/03 1709)      Plan:  [id number] was discharged to home in good condition. Follow-up appointment with delivering provider in 6 weeks.  Discharge Medications: Allergies as of 01/18/2022   No Known Allergies      Medication List     STOP taking these medications    nitrofurantoin 100 MG capsule Commonly known as: Macrodantin       TAKE these medications    acetaminophen 325 MG tablet Commonly known as: Tylenol Take 2 tablets (650 mg total) by mouth every 4 (four) hours as needed (for pain scale < 4).   benzocaine-Menthol 20-0.5 % Aero Commonly known as: DERMOPLAST Apply 1 Application topically as needed for irritation (perineal  discomfort).   coconut oil Oil Apply 1 Application topically as needed.   famotidine 10 MG tablet Commonly known as: PEPCID Take 10 mg by mouth daily. Prn use   ibuprofen 600 MG tablet Commonly known as: ADVIL Take 1 tablet (600 mg total) by mouth every 6 (six) hours.   prenatal multivitamin Tabs tablet Take 1 tablet by mouth daily at 12 noon.   senna-docusate 8.6-50 MG tablet Commonly known as: Senokot-S Take 2 tablets by mouth daily. Start taking on: January 19, 2022   simethicone 80 MG chewable tablet Commonly known as: MYLICON Chew 1 tablet (80 mg total) by mouth as needed for flatulence.   witch hazel-glycerin pad Commonly known as: TUCKS Apply 1 Application topically as needed for hemorrhoids (for pain).         Follow-up Information     January 21, 2022, MD Follow up in 2 week(s).   Specialty: Obstetrics and Gynecology Why: For postop  check Contact information: [address] [place] [address] [phone]         [facility] HEALTH DEPT Follow up in 6 week(s).   Why: postpartum Contact information: [address] [place] [address] [phone]                Signed: Randa Ngo, CNM 01/18/2022 1:03 PM'

## 2022-01-18 NOTE — Progress Notes (Signed)
Interpreter stratus used for d/c education via Vernona Rieger [id number]. Discharge instructions, prescriptions, education, and appointments given and explained. Pt verbalized understanding with no further questions.

## 2022-01-18 NOTE — Progress Notes (Signed)
Pt wheeled to personal vehicle via staff with infant and belongings with s/o for d/c home.

## 2022-01-18 NOTE — Clinical Social Work Maternal (Signed)
CLINICAL SOCIAL WORK MATERNAL/CHILD NOTE  Patient Details  Name: Courtney Newman MRN: [id number] Date of Birth: November 27, 1985  Date:  01/18/2022  Clinical Social Worker Initiating Note:  Doran Clay RN BSN Case Manager Date/Time: Initiated:  01/18/22/1128     Child's Name:  Courtney Newman   Biological Parents:  Mother, Father   Need for Interpreter:  Spanish   Reason for Referral:  Behavioral Health Concerns   Address:  [address] [place] [address]    Phone number:  [phone] (home)     Additional phone number: NA  Household Members/Support Persons (HM/SP):   Household Member/Support Person 1, Household Member/Support Person 2   HM/SP Name Relationship DOB or Age  HM/SP -[place] son 76  HM/SP -[place] daughter 8  HM/SP -3        HM/SP -4        HM/SP -5        HM/SP -6        HM/SP -7        HM/SP -8          Natural Supports (not living in the home):  Spouse/significant other, Immediate Family   Professional Supports:     Employment: Animator   Type of Work: [employer]   Education:  Other (comment) (5th grade)   Homebound arranged:    Museum/gallery curator Resources:  Medicaid   Other Resources:  Physicist, medical  , [employer]   Cultural/Religious Considerations Which May Impact Care:  NA  Strengths:  Ability to meet basic needs  , Compliance with medical plan  , Home prepared for child     Psychotropic Medications:         Pediatrician:       Pediatrician List:   [place]      Pediatrician Fax Number:    Risk Factors/Current Problems:  None   Cognitive State:  Alert     Mood/Affect:  Calm  , Happy     CSW Assessment: TOC consult for Edinburgh postnatal depression score of 10.  RNCM met with patient at the bedside, the father of the baby and her sister are also present.  Father is her spouse but they are separated.  Father-  Courtney Newman, appears to be supportive and asks appropriate questions.  Patient lives with her 2 other children in a house in [place], she has a son 77 and a daughter 63.  Patient reports a good support system at home.  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    Patient denies history of depression or anxiety or of postpartum depression with either of her other 2 children.  Provided patient with a Postpartum Progress Sheet in Spanish to refer to, and encouraged her to follow up with her doctor or the infants pediatrician if she experiences symptoms.  Patient reports that the home is prepared for the infant, they have a bassinet, car seat, clothes, diapers.  Pediatrician will be [facility] and they will call and schedule an appointment now.  Patient gets [facility] and food stamps, she has an appointment at the [facility] office on Wed.  Patient drives and can provide transportation to appointments.    CSW Plan/Description:  Perinatal Mood and Anxiety Disorder (PMADs) Education, No Further Intervention Required/No Barriers to Discharge  Shelbie Hutching, RN 01/18/2022, 11:30 AM

## 2022-01-18 NOTE — Lactation Note (Signed)
This note was copied from a baby's chart. Lactation Consultation Note  Patient Name: Boy Anaiza Behrens [id number] Date: 01/18/2022   Age:37 hours  P3, SVD 35 hours ago. Baby born with cleft lip (no cleft palate). Parents have been formula feeding with speciality bottle provided by [facility]/reviewed with by Speech this morning.  This morning mom voiced desire to express breastmilk for baby as she has done in the past. We discussed different avenues for pump obtainment- Rental, WIC, personal purchase. Mom will plan to call WIC to ensure a pump is available after discharge today.  When asked mom reports being familiar with pump use, cleaning, and milk storage as she did this with her other children.  Fax sent to [facility].  Danford Bad 01/18/2022, 11:32 AM

## 2022-01-19 ENCOUNTER — Telehealth: Payer: Self-pay

## 2022-01-19 LAB — SURGICAL PATHOLOGY

## 2022-01-19 NOTE — Telephone Encounter (Signed)
Courtney Newman, OB case manager called clinic per request of KC/Dr. Dalbert Garnet to fax over to [facility] the BTL consent form for this patient.   BTL consent form faxed to [facility] to confirmation.   Earlyne Iba, RN

## 2022-01-22 ENCOUNTER — Ambulatory Visit: Payer: Medicaid Other

## 2022-02-03 ENCOUNTER — Ambulatory Visit: Payer: Medicaid Other

## 2022-02-03 ENCOUNTER — Ambulatory Visit: Payer: Medicaid Other | Admitting: Advanced Practice Midwife

## 2022-02-03 DIAGNOSIS — Z9851 Tubal ligation status: Secondary | ICD-10-CM

## 2022-02-03 LAB — HEMOGLOBIN, FINGERSTICK: Hemoglobin: 13.2 g/dL (ref 11.1–15.9)

## 2022-02-03 NOTE — Progress Notes (Signed)
S: 36 yo G3P2103 here for 2 wk pp check (f/u to pp hemorrhage, BV tx, complicated BTL, constipation, hemorrhoids). SVD on 01/17/22 after PROM and augmentation, tight nuchal cord, shoulder dystocia x 40 sec, EBL: 500. M 5#11 with cleft lip and palate. BTL with significant uterine/bowel and omental adhesions throughout abdomen and converted to lap due to difficulty (hx c/s and tummy tuck). When pt saw Dr. Dalbert Garnet on 02/01/22, she was dx'd with BV and treated with Flagyl, and pt states Dr. Dalbert Garnet recommended Paraguard pp because she wasn't sure if she had cut both tubes. It appears path report examined both right and left tubes with fimbriated ends. Pt is pumping q 2-3 hours (2 oz) and her sister is helping her with baby. Baby weighed 6 lbs today at Peds apt. C/o constipation and hemorrhoids with last BM 02/01/22 O: 99/69, 97.6, 142.6 pt in NAD. Hgb today A/P: suggestions given for constipation and hemorrhoids. RTC 4 wks for pp exam. Baby to have surgery in 3-4 mo

## 2022-02-03 NOTE — Progress Notes (Signed)
Hgb reviewed - no treatment indicated.   Earlyne Iba, RN

## 2022-02-10 NOTE — Addendum Note (Signed)
Addended by: Marrisa Kimber on: 02/10/2022 10:39 AM   Modules accepted: Orders  

## 2022-02-19 ENCOUNTER — Telehealth: Payer: Self-pay | Admitting: Family Medicine

## 2022-02-19 NOTE — Telephone Encounter (Signed)
Patient states that she is breastfeeding and that she has pain in her breast. Please call

## 2022-02-19 NOTE — Telephone Encounter (Signed)
Client with NSVD at [facility] 01/17/2022 with BTL. Client states has been pumping breast milk as baby did not like the breast and supplementing with formula. Per client, plans to stop pumping ASAP as planning return to work.   Client reports small cracked sore on left nipple and small healing sore on right nipple. Both ooze small amt blood during pumping. Client denies fever, erythematous or edematous breasts.Client is also interested in how to dry up her milk.   Per consult with Dr. Alvester Morin the following recommendations were given to client: Psuedofed 60 mg QD x5 days, cool red cabbage leaves to breasts / cool compresses to breasts, peppermint / sage / clary sage teas to drink, coconut oil to nipples and air dry, breast milk to nipples and air dry.  Client counseled to notify clinic if above measures do not help, breasts become red / red streaked / edematous, fever or any other breast issues of concern. Jossie Ng, RN

## 2022-02-25 ENCOUNTER — Telehealth: Payer: Self-pay

## 2022-02-25 ENCOUNTER — Ambulatory Visit: Payer: Medicaid Other

## 2022-02-25 NOTE — Telephone Encounter (Signed)
Call to client with Courtney Newman to discuss pm appt today as scheduled for post-partum exam with IUD insertion and no available provider for IUD insertion this pm. Client counseled other birth control options available today with exam until can return for IUD placement. Client cancelled appt today stating she has scheduled post-partum exam at [facility] on 03/09/2022. Jossie Ng, RN

## 2022-02-26 ENCOUNTER — Ambulatory Visit: Payer: Medicaid Other

## 2022-03-09 ENCOUNTER — Encounter: Payer: Self-pay | Admitting: Advanced Practice Midwife

## 2022-03-10 ENCOUNTER — Telehealth: Payer: Self-pay

## 2022-03-10 NOTE — Telephone Encounter (Signed)
Call to patient to let her know her FMLA paperwork is completed and ready for pick up at the maternity clinic of [facility]. Patient did not answer and call went  to VM. VM box is full, unable to leave message.   Call to emergency contact, Verdis Frederickson. Call went straight to VM and was able to leave VM regarding above message for [facility]. Left [phone] in case she has any questions.   Al Decant, RN

## 2022-03-11 NOTE — Telephone Encounter (Signed)
Call to patient to let her know her FMLA paperwork is completed and ready for pick up at the maternity clinic of [facility]. Patient states she can come around Monday 03/15/22 to pick up paperwork   Al Decant, RN

## 2022-04-14 ENCOUNTER — Ambulatory Visit
Admission: RE | Admit: 2022-04-14 | Discharge: 2022-04-14 | Disposition: A | Discharge status: Home / Self Care | Payer: Worker's Compensation | Source: Ambulatory Visit | Attending: Family Medicine | Admitting: Family Medicine

## 2022-04-14 ENCOUNTER — Ambulatory Visit
Admission: RE | Admit: 2022-04-14 | Discharge: 2022-04-14 | Disposition: A | Discharge status: Home / Self Care | Payer: Worker's Compensation | Attending: Family | Admitting: Family

## 2022-04-14 ENCOUNTER — Other Ambulatory Visit: Payer: Self-pay | Admitting: Family Medicine

## 2022-04-14 DIAGNOSIS — Y990 Civilian activity done for income or pay: Secondary | ICD-10-CM | POA: Insufficient documentation

## 2022-04-14 DIAGNOSIS — W19XXXA Unspecified fall, initial encounter: Secondary | ICD-10-CM | POA: Diagnosis not present

## 2022-04-14 DIAGNOSIS — M7989 Other specified soft tissue disorders: Secondary | ICD-10-CM | POA: Diagnosis not present

## 2022-04-14 DIAGNOSIS — S60222A Contusion of left hand, initial encounter: Secondary | ICD-10-CM | POA: Diagnosis not present

## 2022-04-14 DIAGNOSIS — M79642 Pain in left hand: Secondary | ICD-10-CM | POA: Insufficient documentation

## 2022-08-30 ENCOUNTER — Other Ambulatory Visit: Payer: Self-pay

## 2022-08-30 ENCOUNTER — Encounter: Payer: Self-pay | Admitting: *Deleted

## 2022-08-30 ENCOUNTER — Emergency Department
Admission: EM | Admit: 2022-08-30 | Discharge: 2022-08-30 | Disposition: A | Discharge status: Home / Self Care | Payer: Medicaid Other | Attending: Emergency Medicine | Admitting: Emergency Medicine

## 2022-08-30 ENCOUNTER — Emergency Department: Payer: Medicaid Other

## 2022-08-30 DIAGNOSIS — R1032 Left lower quadrant pain: Secondary | ICD-10-CM

## 2022-08-30 LAB — COMPREHENSIVE METABOLIC PANEL
ALT: 17 U/L (ref 0–44)
AST: 20 U/L (ref 15–41)
Albumin: 3.9 g/dL (ref 3.5–5.0)
Alkaline Phosphatase: 80 U/L (ref 38–126)
Anion gap: 7 (ref 5–15)
BUN: 15 mg/dL (ref 6–20)
CO2: 24 mmol/L (ref 22–32)
Calcium: 8.8 mg/dL — ABNORMAL LOW (ref 8.9–10.3)
Chloride: 109 mmol/L (ref 98–111)
Creatinine, Ser: 0.81 mg/dL (ref 0.44–1.00)
GFR, Estimated: >60 mL/min (ref >60)
Glucose, Bld: 138 mg/dL — ABNORMAL HIGH (ref 70–99)
Potassium: 3.4 mmol/L — ABNORMAL LOW (ref 3.5–5.1)
Sodium: 140 mmol/L (ref 135–145)
Total Bilirubin: 0.4 mg/dL (ref 0.3–1.2)
Total Protein: 6.9 g/dL (ref 6.5–8.1)

## 2022-08-30 LAB — URINALYSIS, ROUTINE W REFLEX MICROSCOPIC
Bilirubin Urine: NEGATIVE
Glucose, UA: NEGATIVE mg/dL
Hgb urine dipstick: NEGATIVE
Ketones, ur: NEGATIVE mg/dL
Leukocytes,Ua: NEGATIVE
Nitrite: NEGATIVE
Protein, ur: NEGATIVE mg/dL
Specific Gravity, Urine: 1.008 (ref 1.005–1.030)
pH: 6 (ref 5.0–8.0)

## 2022-08-30 LAB — LIPASE, BLOOD: Lipase: 45 U/L (ref 11–51)

## 2022-08-30 LAB — CBC
HCT: 40.4 % (ref 36.0–46.0)
Hemoglobin: 13.6 g/dL (ref 12.0–15.0)
MCH: 30.3 pg (ref 26.0–34.0)
MCHC: 33.7 g/dL (ref 30.0–36.0)
MCV: 90 fL (ref 80.0–100.0)
Platelets: 266 10*3/uL (ref 150–400)
RBC: 4.49 MIL/uL (ref 3.87–5.11)
RDW: 12.2 % (ref 11.5–15.5)
WBC: 9.1 10*3/uL (ref 4.0–10.5)
nRBC: 0 % (ref 0.0–0.2)

## 2022-08-30 LAB — POC URINE PREG, ED: Preg Test, Ur: NEGATIVE

## 2022-08-30 MED ORDER — DOCUSATE SODIUM 100 MG PO CAPS: 100.0000 mg | ORAL_CAPSULE | Freq: Two times a day (BID) | ORAL | 0 refills | Status: AC

## 2022-08-30 MED ORDER — DICYCLOMINE HCL 10 MG PO CAPS: 10.0000 mg | ORAL_CAPSULE | Freq: Three times a day (TID) | ORAL | 0 refills | Status: AC | PRN

## 2022-08-30 MED ORDER — DICYCLOMINE HCL 10 MG PO CAPS
10.0000 mg | ORAL_CAPSULE | Freq: Once | ORAL | Status: AC
  Administered 2022-08-30: 10 mg via ORAL
  Filled 2022-08-30: qty 1

## 2022-08-30 NOTE — ED Provider Notes (Signed)
[facility] Provider Note    Event Date/Time   First MD Initiated Contact with Patient 08/30/22 2108     (approximate) Manage interpreter used for this evaluation  History   Chief Complaint: Abdominal Pain  HPI  Courtney Newman is a 37 y.o. female with no significant past medical history who presents to the emergency department for left lower quadrant abdominal pain.  According to the patient for the past 2 to 3 weeks she has been experiencing intermittent discomfort at left lower quadrant that she describes more as a pressure.  Denies any urinary symptoms denies any hematuria no vaginal bleeding or discharge.  Denies any diarrhea but states she does have a bowel movement approximately 2 times per day.  Denies any nausea or vomiting.  States the pain was somewhat worse today so she came to the emergency department for evaluation.  Physical Exam   Triage Vital Signs: ED Triage Vitals  Enc Vitals Group     BP 08/30/22 1833 121/88     Pulse Rate 08/30/22 1833 (!) 104     Resp 08/30/22 1833 18     Temp 08/30/22 1833 98.2 F (36.8 C)     Temp Source 08/30/22 1833 Oral     SpO2 08/30/22 1833 100 %     Weight 08/30/22 1834 128 lb (58.1 kg)     Height 08/30/22 1834 4\' 11"  (1.499 m)     Head Circumference --      Peak Flow --      Pain Score 08/30/22 1833 8     Pain Loc --      Pain Edu? --      Excl. in GC? --     Most recent vital signs: Vitals:   08/30/22 1833  BP: 121/88  Pulse: (!) 104  Resp: 18  Temp: 98.2 F (36.8 C)  SpO2: 100%    General: Awake, no distress.  CV:  Good peripheral perfusion.  Regular rate and rhythm  Resp:  Normal effort.  Equal breath sounds bilaterally.  Abd:  No distention.  Soft, mild left lower quadrant tenderness to palpation without rebound or guarding.  ED Results / Procedures / Treatments   RADIOLOGY  I have reviewed and interpreted the CT images.  I do not see any obvious kidney stone or other significant  abnormality on my evaluation. Radiology has read the CT scan is essentially negative for acute process.   MEDICATIONS ORDERED IN ED: Medications - No data to display   IMPRESSION / MDM / ASSESSMENT AND PLAN / ED COURSE  I reviewed the triage vital signs and the nursing notes.  Patient's presentation is most consistent with acute presentation with potential threat to life or bodily function.  Patient presents emergency department for several weeks of left lower quadrant abdominal pain.  Overall the patient appears well, no distress.  Does have mild to moderate tenderness in left lower quadrant no rebound or guarding.  Patient's lab work is reassuring including a normal urinalysis, normal CBC with a normal white blood cell count, reassuring chemistry negative lipase negative pregnancy test.  Given the patient's left flank pain I have proceeded with a CT scan to evaluate.  Noncontrasted CT scan does not show any kidney stones nor does it show any diverticulitis or colitis.  When I reviewed the images patient does appear to have mild amount of constipation/retained stool.  Patient states she has a bowel movement approximately twice daily we will place the patient on  a stool softener Colace as well as Bentyl to see if this helps with the patient's discomfort if not I have referred the patient to GI medicine.  Provided my typical abdominal pain return precautions.  Patient agreeable to plan of care.  FINAL CLINICAL IMPRESSION(S) / ED DIAGNOSES   Left lower quadrant abdominal pain   Rx / DC Orders   Bentyl Colace  Note:  This document was prepared using Dragon voice recognition software and may include unintentional dictation errors.   Minna Antis, MD 08/30/22 2229

## 2022-08-30 NOTE — Discharge Instructions (Addendum)
As we discussed please take your Bentyl as needed for discomfort as well as her Colace twice daily.  If you begin having diarrhea/loose stool you may discontinue Colace or cut back to once daily.  If you continue to have abdominal pain over the next week or so after using these medications please call the number provided for GI medicine to arrange a follow-up appointment.  Return to the emergency department for any worsening pain or anySymptom

## 2022-08-30 NOTE — ED Notes (Signed)
Poct pregnancy Negative 

## 2022-08-30 NOTE — ED Triage Notes (Signed)
Pt reports lower abd pain for 3 weeks.  Pain worse since yesterday.  Pt has dysuria.  No vag bleeding or discharge.  Pt alert.

## 2022-11-16 ENCOUNTER — Ambulatory Visit
Admission: EM | Admit: 2022-11-16 | Discharge: 2022-11-16 | Disposition: A | Discharge status: Home / Self Care | Payer: Medicaid Other | Attending: Emergency Medicine | Admitting: Emergency Medicine

## 2022-11-16 DIAGNOSIS — K219 Gastro-esophageal reflux disease without esophagitis: Secondary | ICD-10-CM | POA: Diagnosis not present

## 2022-11-16 MED ORDER — OMEPRAZOLE 20 MG PO CPDR: 20.0000 mg | DELAYED_RELEASE_CAPSULE | Freq: Every day | ORAL | 0 refills | Status: AC

## 2022-11-16 MED ORDER — ONDANSETRON HCL 4 MG/2ML IJ SOLN
4.0000 mg | Freq: Once | INTRAMUSCULAR | Status: AC
  Administered 2022-11-16: 4 mg via INTRAMUSCULAR

## 2022-11-16 MED ORDER — ONDANSETRON 4 MG PO TBDP: 4.0000 mg | ORAL_TABLET | Freq: Three times a day (TID) | ORAL | 0 refills | Status: AC | PRN

## 2022-11-16 MED ORDER — ALUM & MAG HYDROXIDE-SIMETH 200-200-20 MG/5ML PO SUSP
30.0000 mL | Freq: Once | ORAL | Status: AC
  Administered 2022-11-16: 30 mL via ORAL

## 2022-11-16 MED ORDER — ONDANSETRON 8 MG PO TBDP
8.0000 mg | ORAL_TABLET | Freq: Once | ORAL | Status: AC
  Administered 2022-11-16: 8 mg via ORAL

## 2022-11-16 MED ORDER — ALUMINUM-MAGNESIUM-SIMETHICONE 200-200-20 MG/5ML PO SUSP: 30.0000 mL | Freq: Three times a day (TID) | ORAL | 0 refills | Status: AC

## 2022-11-16 NOTE — ED Triage Notes (Addendum)
Triage completed using spanish interpreter, Donald Pore [phone].  Patient to Urgent Care with complaints of nausea and emesis. Woke up yesterday with symptoms. Epigastric pain. Denies any diarrhea.   Reports she has felt light headed/ nauseated for over a month. Reports intermittent headaches.

## 2022-11-16 NOTE — Discharge Instructions (Signed)
Hoy ests siendo tratado por reflujo cido e indigestin, conocido como ERGE, en tu documentacin hay ms informacin sobre esta condicin.  Le han administrado Zofran y Maalox hoy aqu en la oficina para ayudarlo a calmar sus vmitos y Systems analyst.  Puede usar Zofran en casa cada 8 horas, colocarlo debajo de la lengua y dejar que se disuelva, luego esperar al menos 30 minutos antes de intentar comer o beber despus.  Comience a usar omeprazol todas las [employer] durante al menos [address], si los sntomas an estn presentes pueden continuar durante 2 semanas ms, este medicamento ayuda a Careers information officer cido del Teaching laboratory technician y protege el estmago de la formacin de [place].  Puede usar Maalox cada 6 horas segn sea necesario para ayudar an ms a reducir el cido del estmago y reducir los gases; lo ideal es tomarlo 30 minutos antes de comer.  Despus de las comidas, debe descansar durante al menos 30 minutos para ayudar a reducir el reflujo de cido del estmago y ayudar con la digestin.  Mientras los sntomas estn presentes, coma una dieta blanda para evitar una mayor irritacin por los alimentos.  Puede realizar un seguimiento con esta atencin de urgencia segn sea necesario, tambin puede realizar un seguimiento con su mdico de atencin primaria para una reevaluacin segn sea necesario.   Today you are being treated for acid reflux and indigestion, known as GERD, there is more information in your paperwork about this condition  You have been given Zofran and Maalox here today in the office to help settle your vomiting and stomach  You may use Zofran at home every 8 hours, placed under tongue and allowed to dissolve then wait at least 30 minutes before attempting to eat or drink after  Begin use of omeprazole every morning for at least 14 days, if symptoms are still present may continue for an additional 2 weeks, this medicine helps to reduce stomach acid and protect the stomach from forming  ulcers  You may use Maalox every 6 hours as needed to further help reduce stomach acid and reduce gas, ideally take 30 minutes prior to eating  After meals she need to set up for at least 30 minutes to help reduce backflow of stomach acid and help aid with digestion  While symptoms are present eat a bland diet to prevent further irritation from food  You may follow-up with this urgent care as needed, you may also follow-up with your primary doctor for reevaluation as needed

## 2022-11-16 NOTE — ED Provider Notes (Signed)
[facility]    CSN: [id number] Arrival date & time: 11/16/22  1106      History   Chief Complaint Chief Complaint  Patient presents with   Emesis    HPI Courtney Newman is a 37 y.o. female.   Patient presents for evaluation of intermittent nausea, intermittent generalized headaches and intermittent centralized abdominal pain present for 1 month.  Began to experience vomiting 1 day ago.  Associated abdominal bloating and increased gas production, at times feels as if the food she is consumed is sitting within the center of the chest.  Symptoms exacerbated by food consumption and specifically soda.  Has attempted use of Tums and Emetrol which has been ineffective.  Denies dietary changes, recent travel or sick contacts.  Denies presence of fever or URI symptoms, diarrhea.  Had similar symptoms in the past but this was during pregnancy and feels it was more so related to this.  Denies over consumption of spicy, greasy foods but does endorse that she may drink 3 to 4 cups of caffeine a day.  Past Medical History:  Diagnosis Date   Ear infection 06/2021   tx with Amoxicillin   Preterm labor     Patient Active Problem List   Diagnosis Date Noted   Carrier of spinal muscular atrophy 09/22/2021   Epigastric pain 09/11/2021   History of preterm delivery at 35 wks 10/16/2008 4#4 07/16/2021   Hx of breast augmentation 2017 [place] 07/16/2021   H/O tubal ligation  01/17/22 07/16/2021    Past Surgical History:  Procedure Laterality Date   CESAREAN SECTION  09/09/2013   PLACEMENT OF BREAST IMPLANTS Bilateral 2017   TUBAL LIGATION N/A 01/17/2022   Procedure: POST PARTUM TUBAL LIGATION CONVERSION TO LAPAROSCOPY;  Surgeon: Christeen Douglas, MD;  Location: [facility] ORS;  Service: Gynecology;  Laterality: N/A;    OB History     Gravida  3   Para  3   Term  2   Preterm  1   AB  0   Living  3      SAB  0   IAB  0   Ectopic  0   Multiple  0   Live Births  3         Obstetric Comments  10/16/2008 with SROM and EDC=11/16/2008 (35 wks)          Home Medications    Prior to Admission medications   Medication Sig Start Date End Date Taking? Authorizing Provider  aluminum-magnesium hydroxide-simethicone (MAALOX) 200-200-20 MG/5ML SUSP Take 30 mLs by mouth 4 (four) times daily -  before meals and at bedtime. 11/16/22  Yes Jeanna Giuffre, Elita Boone, NP  omeprazole (PRILOSEC) 20 MG capsule Take 1 capsule (20 mg total) by mouth daily. 11/16/22  Yes Keron Neenan R, NP  ondansetron (ZOFRAN-ODT) 4 MG disintegrating tablet Take 1 tablet (4 mg total) by mouth every 8 (eight) hours as needed for nausea or vomiting. 11/16/22  Yes Denica Web, Elita Boone, NP  acetaminophen (TYLENOL) 325 MG tablet Take 2 tablets (650 mg total) by mouth every 4 (four) hours as needed (for pain scale < 4). 01/18/22   McVey, Prudencio Pair, CNM  benzocaine-Menthol (DERMOPLAST) 20-0.5 % AERO Apply 1 Application topically as needed for irritation (perineal discomfort). Patient not taking: Reported on 02/03/2022 01/18/22   McVey, Lurena Joiner A, CNM  coconut oil OIL Apply 1 Application topically as needed. Patient not taking: Reported on 02/03/2022 01/18/22   McVey, Prudencio Pair, CNM  dicyclomine (BENTYL) 10 MG  capsule Take 1 capsule (10 mg total) by mouth 3 (three) times daily as needed for spasms. 08/30/22   Minna Antis, MD  docusate sodium (COLACE) 100 MG capsule Take 1 capsule (100 mg total) by mouth 2 (two) times daily. 08/30/22 08/30/23  Minna Antis, MD  famotidine (PEPCID) 10 MG tablet Take 10 mg by mouth daily. Prn use Patient not taking: Reported on 02/03/2022    [provider]  ibuprofen (ADVIL) 600 MG tablet Take 1 tablet (600 mg total) by mouth every 6 (six) hours. Patient not taking: Reported on 02/03/2022 01/18/22   McVey, Prudencio Pair, CNM  mineral oil enema Place 1 enema rectally once.    [provider]  Prenatal Vit-Fe Fumarate-FA (PRENATAL MULTIVITAMIN) TABS tablet Take 1  tablet by mouth daily at 12 noon.    [provider]  senna-docusate (SENOKOT-S) 8.6-50 MG tablet Take 2 tablets by mouth daily. 01/19/22   McVey, Prudencio Pair, CNM  simethicone (MYLICON) 80 MG chewable tablet Chew 1 tablet (80 mg total) by mouth as needed for flatulence. Patient not taking: Reported on 02/03/2022 01/18/22   McVey, Prudencio Pair, CNM  witch hazel-glycerin (TUCKS) pad Apply 1 Application topically as needed for hemorrhoids (for pain). Patient not taking: Reported on 02/03/2022 01/18/22   McVey, Prudencio Pair, CNM    Family History Family History  Problem Relation Age of Onset   Diabetes Mother    Healthy Father     Social History Social History   Tobacco Use   Smoking status: Never    Passive exposure: Never   Smokeless tobacco: Never  Vaping Use   Vaping status: Never Used  Substance Use Topics   Alcohol use: Not Currently    Comment: occ, last use 03/01/21   Drug use: Never     Allergies   Patient has no known allergies.   Review of Systems Review of Systems   Physical Exam Triage Vital Signs ED Triage Vitals  Encounter Vitals Group     BP 11/16/22 1158 115/76     Systolic BP Percentile --      Diastolic BP Percentile --      Pulse Rate 11/16/22 1158 (!) 102     Resp 11/16/22 1158 18     Temp 11/16/22 1158 98.6 F (37 C)     Temp src --      SpO2 11/16/22 1158 98 %     Weight --      Height --      Head Circumference --      Peak Flow --      Pain Score 11/16/22 1159 7     Pain Loc --      Pain Education --      Exclude from Growth Chart --    No data found.  Updated Vital Signs BP 115/76   Pulse (!) 102   Temp 98.6 F (37 C)   Resp 18   LMP 11/08/2022   SpO2 98%   Visual Acuity Right Eye Distance:   Left Eye Distance:   Bilateral Distance:    Right Eye Near:   Left Eye Near:    Bilateral Near:     Physical Exam Constitutional:      Appearance: Normal appearance.  Eyes:     Extraocular Movements: Extraocular  movements intact.  Pulmonary:     Effort: Pulmonary effort is normal.  Abdominal:     General: Abdomen is flat. Bowel sounds are increased.     Palpations:  Abdomen is soft.     Tenderness: There is abdominal tenderness in the epigastric area, left upper quadrant and left lower quadrant.  Neurological:     Mental Status: She is alert and oriented to person, place, and time. Mental status is at baseline.      UC Treatments / Results  Labs (all labs ordered are listed, but only abnormal results are displayed) Labs Reviewed - No data to display  EKG   Radiology No results found.  Procedures Procedures (including critical care time)  Medications Ordered in UC Medications  ondansetron (ZOFRAN-ODT) disintegrating tablet 8 mg (8 mg Oral Given 11/16/22 1221)  alum & mag hydroxide-simeth (MAALOX/MYLANTA) 200-200-20 MG/5ML suspension 30 mL (30 mLs Oral Given 11/16/22 1253)  ondansetron (ZOFRAN) injection 4 mg (4 mg Intramuscular Given 11/16/22 1240)    Initial Impression / Assessment and Plan / UC Course  I have reviewed the triage vital signs and the nursing notes.  Pertinent labs & imaging results that were available during my care of the patient were reviewed by me and considered in my medical decision making (see chart for details).  GERD without esophagitis  Vital signs are stable while ill-appearing patient is in no signs of distress, actively vomiting within exam room, attempted 8 mg of ODT Zofran, ineffective, 4 mg of IM Zofran given, vomiting resolved, Maalox given and abdominal pain has begun to subside, tenderness noted to the epigastric region in the left upper and lower quadrant, increased bowel sounds, patient is not guarding and I have a low suspicion for an acute process, stable for outpatient management and will defer imaging at this time, as symptoms have improved with medication, have sent Zofran, omeprazole and Maalox to pharmacy and discussed administration,  recommended a bland diet until symptoms have resolved and advised additional supportive measures as well as given written handout regarding GERD, may follow-up with urgent care or primary doctor as needed for reevaluation  Spanish interpreter used Final Clinical Impressions(s) / UC Diagnoses   Final diagnoses:  GERD without esophagitis     Discharge Instructions      Hoy ests siendo tratado por reflujo cido e indigestin, conocido como ERGE, en tu documentacin hay ms informacin sobre esta condicin.  Le han administrado Zofran y Maalox hoy aqu en la oficina para ayudarlo a calmar sus vmitos y Systems analyst.  Puede usar Zofran en casa cada 8 horas, colocarlo debajo de la lengua y dejar que se disuelva, luego esperar al menos 30 minutos antes de intentar comer o beber despus.  Comience a usar omeprazol todas las [employer] durante al menos [address] si los sntomas an estn presentes pueden continuar durante 2 semanas ms, este medicamento ayuda a Careers information officer cido del Teaching laboratory technician y protege el estmago de la formacin de [place].  Puede usar Maalox cada 6 horas segn sea necesario para ayudar an ms a reducir el cido del estmago y reducir los gases; lo ideal es tomarlo 30 minutos antes de comer.  Despus de las comidas, debe descansar durante al menos 30 minutos para ayudar a reducir el reflujo de cido del estmago y ayudar con la digestin.  Mientras los sntomas estn presentes, coma una dieta blanda para evitar una mayor irritacin por los alimentos.  Puede realizar un seguimiento con esta atencin de urgencia segn sea necesario, tambin puede realizar un seguimiento con su mdico de atencin primaria para una reevaluacin segn sea necesario.   Today you are being treated for acid reflux and indigestion, known as GERD,  there is more information in your paperwork about this condition  You have been given Zofran and Maalox here today in the office to help settle your vomiting and  stomach  You may use Zofran at home every 8 hours, placed under tongue and allowed to dissolve then wait at least 30 minutes before attempting to eat or drink after  Begin use of omeprazole every morning for at least 14 days, if symptoms are still present may continue for an additional 2 weeks, this medicine helps to reduce stomach acid and protect the stomach from forming ulcers  You may use Maalox every 6 hours as needed to further help reduce stomach acid and reduce gas, ideally take 30 minutes prior to eating  After meals she need to set up for at least 30 minutes to help reduce backflow of stomach acid and help aid with digestion  While symptoms are present eat a bland diet to prevent further irritation from food  You may follow-up with this urgent care as needed, you may also follow-up with your primary doctor for reevaluation as needed   ED Prescriptions     Medication Sig Dispense Auth. Provider   omeprazole (PRILOSEC) 20 MG capsule Take 1 capsule (20 mg total) by mouth daily. 30 capsule Herald Vallin R, NP   ondansetron (ZOFRAN-ODT) 4 MG disintegrating tablet Take 1 tablet (4 mg total) by mouth every 8 (eight) hours as needed for nausea or vomiting. 20 tablet Salli Quarry R, NP   aluminum-magnesium hydroxide-simethicone (MAALOX) 200-200-20 MG/5ML SUSP Take 30 mLs by mouth 4 (four) times daily -  before meals and at bedtime. 1,680 mL Valinda Hoar, NP      PDMP not reviewed this encounter.   Valinda Hoar, NP 11/16/22 1425

## 2023-01-03 NOTE — Progress Notes (Signed)
01-03-23 for 12-31-22, [facility] Department received a request from [employer] for medical records for the purposes of HEDIS activities for dates of service 2023-2024 for patient Courtney Newman, dob: [id number]. Per request medical records released include the following: Prenatal records from: 07-16-21, 07-31-21, 08-14-21, 09-11-21, 09-24-21, 10-09-21, 11-13-21, 11-27-21, 12-11-21, 12-25-21, 01-08-22, 01-14-22, 02-03-22. Lab results from: 07-16-21, 08-14-21, 10-09-21, 10-12-21, 11-13-21, 01-08-22, and 02-03-22. Medical records were mailed certified mail to [facility], [address] [place], [place] [address]. Herby Abraham RN

## 2023-04-23 IMAGING — US US MFM OB COMP +14 WKS
1 series · 14 of 28 positions shown · non-contrast
Comparison: none

[Series 1: us mfm ob comp +14 wks · 62 acquisitions, 14 frames shown]
[im 3/62]
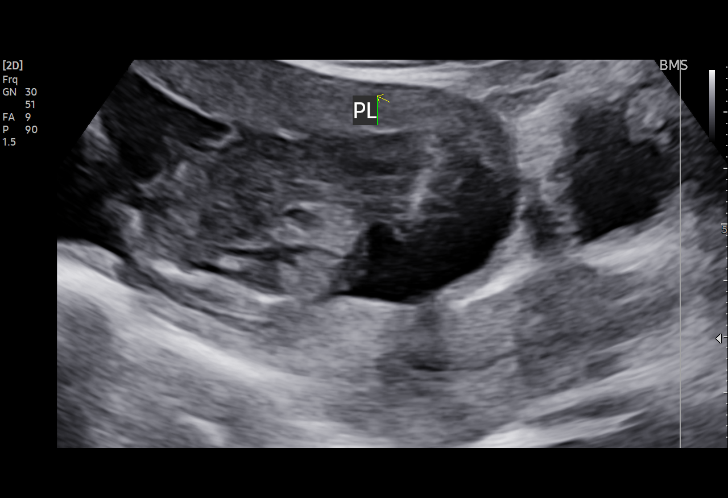
[im 7/62]
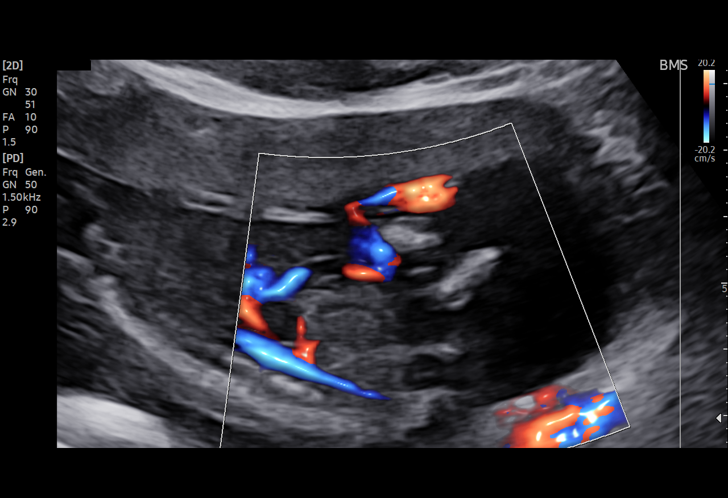
[im 12/62]
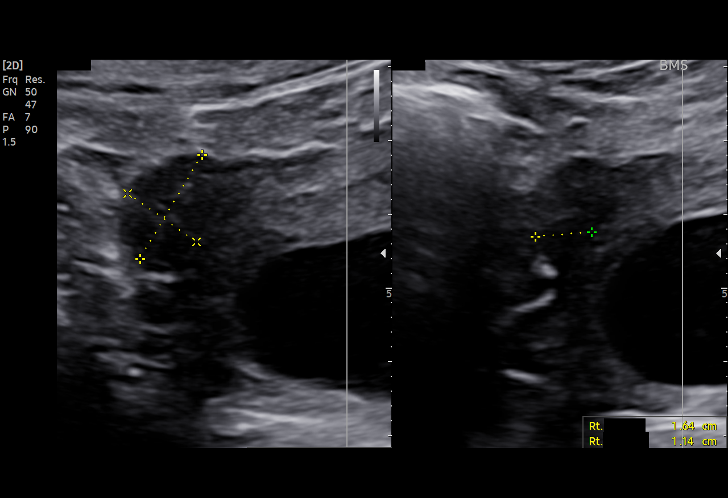
[im 16/62]
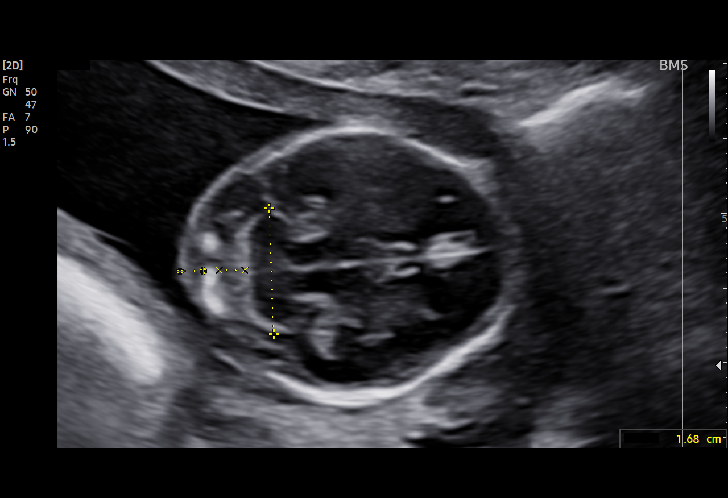
[im 21/62]
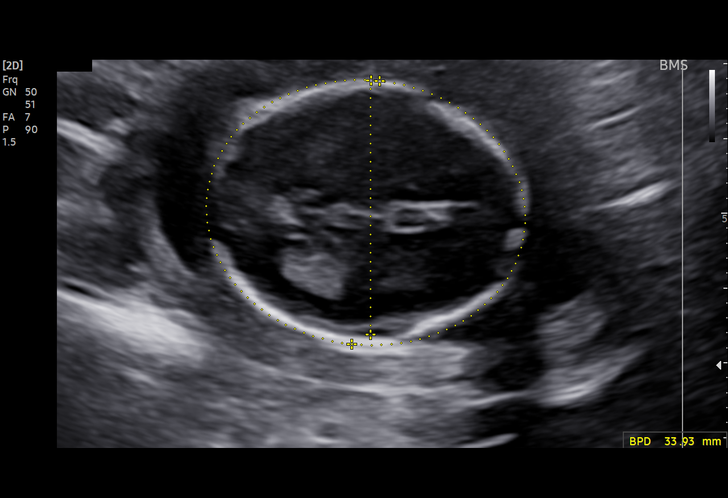
[im 25/62]
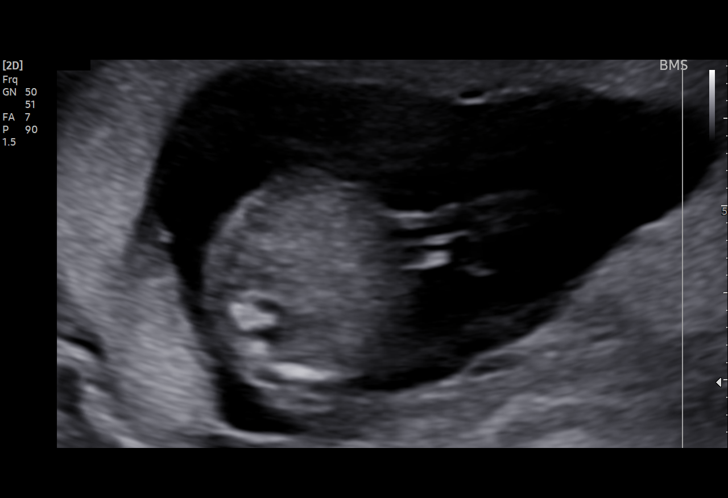
[im 30/62]
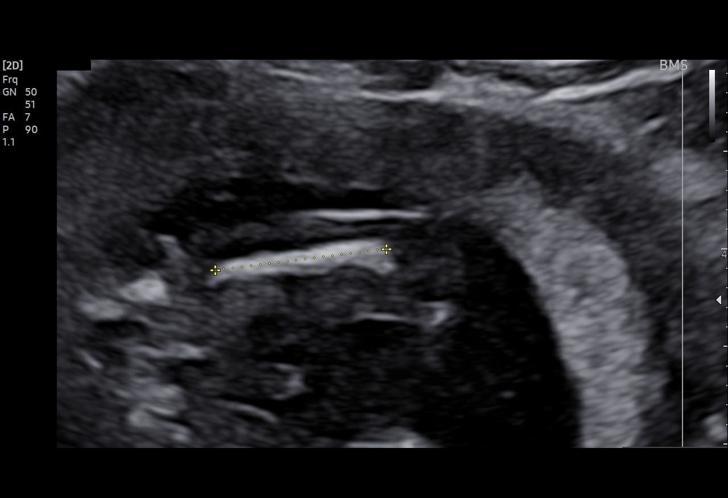
[im 34/62]
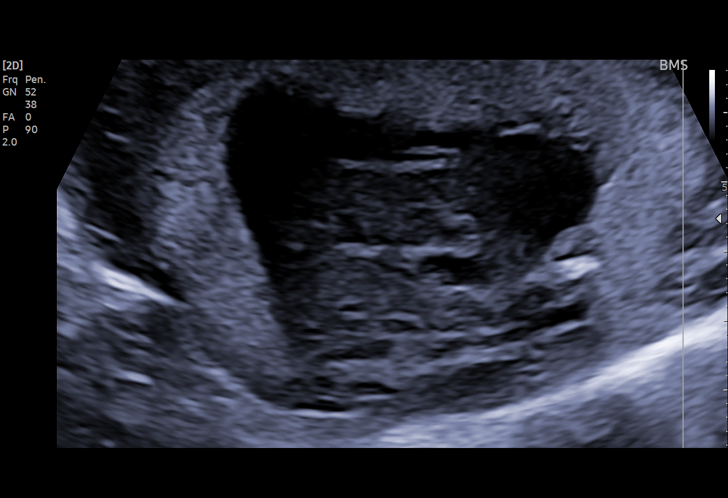
[im 39/62]
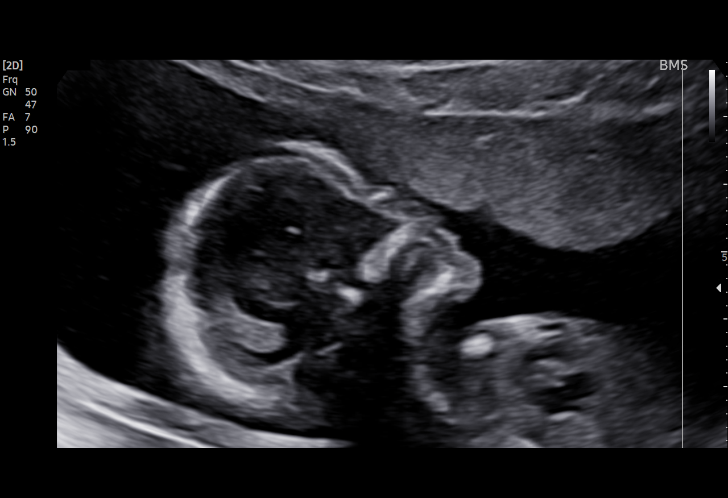
[im 43/62]
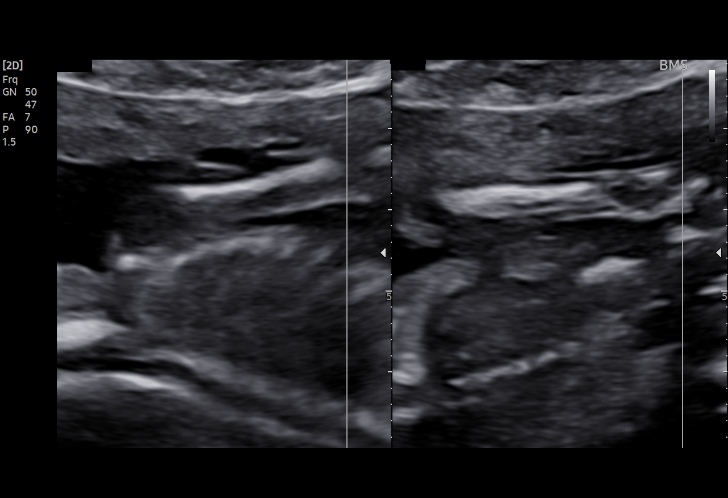
[im 48/62]
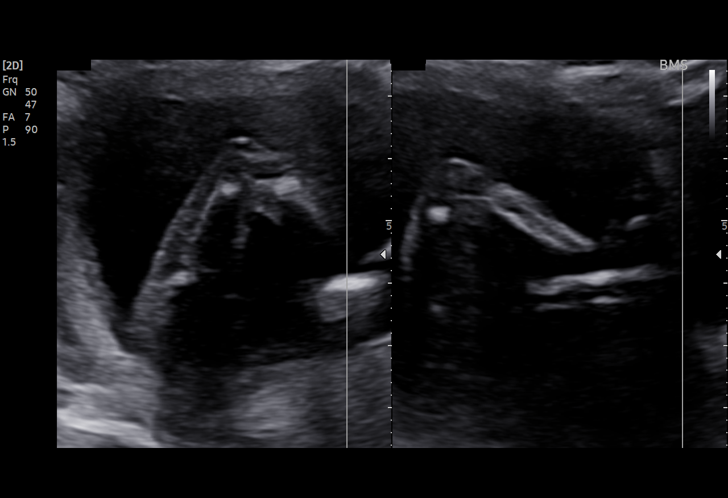
[im 52/62]
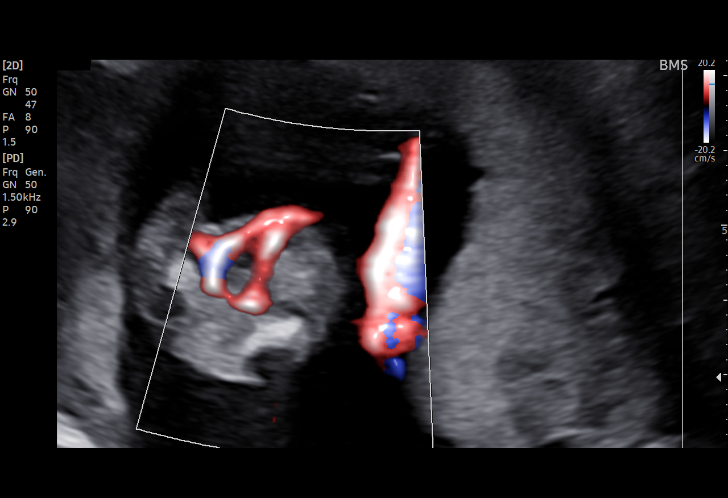
[im 57/62]
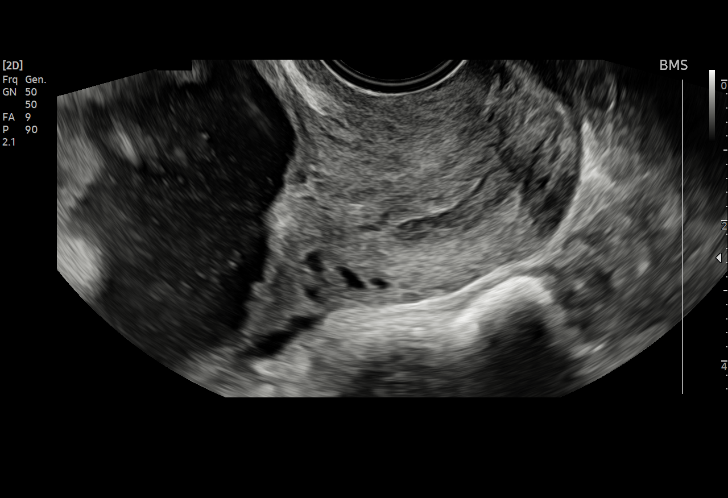
[im 62/62]
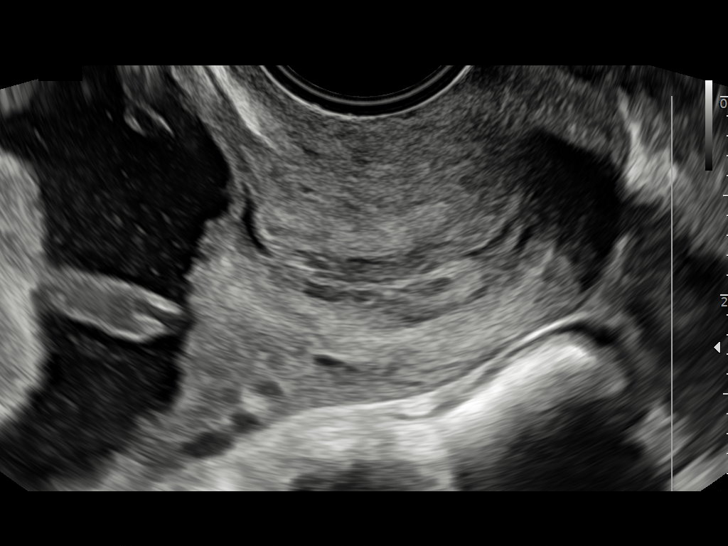

[14 of 28 positions shown; findings below may reference images not displayed]

GERHARDAS CNM

 1  US MFM OB COMP + 14 WK                76805.01    [facility]
 2  US MFM OB TRANSVAGINAL                76817.2     [facility]

Indications

 Encounter for uncertain dates
 Poor obstetric history: Previous preterm
 delivery, antepartum (50w9d)
 History of cesarean delivery, currently
 pregnant
 16 weeks gestation of pregnancy
Fetal Evaluation

 Num Of Fetuses:         1
 Fetal Heart Rate(bpm):  146
 Cardiac Activity:       Observed
 Presentation:           Cephalic
 Placenta:               Anterior
 P. Cord Insertion:      Visualized, central

 Amniotic Fluid
 AFI FV:      Within normal limits

                             Largest Pocket(cm)

Biometry

 BPD:      34.6  mm     G. Age:  16w 5d         66  %    CI:        78.17   %    70 - 86
                                                         FL/HC:      14.7   %    13.3 -
 HC:    123.81   mm     G. Age:  16w 2d         33  %    HC/AC:      1.18        1.05 -
 AC:    104.68   mm     G. Age:  16w 3d         56  %    FL/BPD:     52.7   %
 FL:      18.23  mm     G. Age:  15w 3d         14  %    FL/AC:      17.4   %    20 - 24
 CER:      16.8  mm     G. Age:  17w 0d         68  %
 NFT:       3.1  mm
 CM:        3.4  mm

 Est. FW:     141  gm      0 lb 5 oz     23  %
OB History

 Gravidity:    3
 Living:       2
Gestational Age

 LMP:           17w 4d        Date:  04/17/21                  EDD:   01/22/22
 U/S Today:     16w 2d                                        EDD:   01/31/22
 Best:          16w 2d     Det. By:  U/S (08/18/21)           EDD:   01/31/22
Anatomy

 Cranium:               Visualized             Stomach:                Visualized
 Cavum:                 Visualized             Abdominal Wall:         Visualized
 Ventricles:            Visualized             Cord Vessels:           Visualized
 Choroid Plexus:        Visualized             Kidneys:                Visualized
 Cerebellum:            Visualized             Bladder:                Visualized
 Posterior Fossa:       Visualized             Upper Extremities:      Visualized
 Face:                  Visualized             Lower Extremities:      Visualized
 Heart:                 Visualized
Cervix Uterus Adnexa

 Cervix
 Length:           2.85  cm.
 Normal appearance by transabdominal scan.

 Uterus
 No abnormality visualized.

 Right Ovary
 Size(cm)     1.64   x   0.76   x  1.14      Vol(ml):
 Within normal limits.

 Left Ovary
 Not visualized.

 Cul De Sac
 No free fluid seen.

 Adnexa
 No adnexal mass visualized.
Impression
 Single intrauterine pregnancy here for a completed anatomy
 Normal anatomy with measurements consistent with dates
 There is good fetal movement and amniotic fluid volume
 Suboptimal views of the fetal anatomy were obtained
 secondary to fetal position and early gestation.

 CL is 2.85 cm
Recommendations

 Repeat CL in 2 weeks.
 Repeat growth in 5 weeks.

## 2023-09-13 ENCOUNTER — Other Ambulatory Visit: Payer: Self-pay | Admitting: Family Medicine

## 2023-09-13 DIAGNOSIS — Z3481 Encounter for supervision of other normal pregnancy, first trimester: Secondary | ICD-10-CM

## 2023-09-16 ENCOUNTER — Ambulatory Visit: Admission: RE | Admit: 2023-09-16 | Source: Home / Self Care

## 2023-09-16 ENCOUNTER — Encounter: Admission: RE | Payer: Self-pay | Source: Home / Self Care

## 2023-09-16 SURGERY — EGD (ESOPHAGOGASTRODUODENOSCOPY)
Anesthesia: General

## 2023-09-19 ENCOUNTER — Ambulatory Visit
Admission: RE | Admit: 2023-09-19 | Discharge: 2023-09-19 | Disposition: A | Discharge status: Home / Self Care | Source: Ambulatory Visit | Attending: Family Medicine | Admitting: Family Medicine

## 2023-09-19 DIAGNOSIS — Z3481 Encounter for supervision of other normal pregnancy, first trimester: Secondary | ICD-10-CM | POA: Insufficient documentation

## 2023-09-19 DIAGNOSIS — Z3A09 9 weeks gestation of pregnancy: Secondary | ICD-10-CM | POA: Insufficient documentation

## 2023-10-19 ENCOUNTER — Encounter: Payer: Self-pay | Admitting: Emergency Medicine

## 2023-10-19 ENCOUNTER — Other Ambulatory Visit: Payer: Self-pay

## 2023-10-19 ENCOUNTER — Emergency Department
Admission: EM | Admit: 2023-10-19 | Discharge: 2023-10-19 | Disposition: A | Discharge status: Home / Self Care | Attending: Emergency Medicine | Admitting: Emergency Medicine

## 2023-10-19 DIAGNOSIS — R300 Dysuria: Secondary | ICD-10-CM

## 2023-10-19 DIAGNOSIS — O26892 Other specified pregnancy related conditions, second trimester: Secondary | ICD-10-CM | POA: Diagnosis present

## 2023-10-19 DIAGNOSIS — M545 Low back pain, unspecified: Secondary | ICD-10-CM | POA: Diagnosis not present

## 2023-10-19 DIAGNOSIS — O99892 Other specified diseases and conditions complicating childbirth: Secondary | ICD-10-CM | POA: Diagnosis not present

## 2023-10-19 DIAGNOSIS — Z3A15 15 weeks gestation of pregnancy: Secondary | ICD-10-CM | POA: Diagnosis not present

## 2023-10-19 LAB — URINALYSIS, ROUTINE W REFLEX MICROSCOPIC
Bilirubin Urine: NEGATIVE
Glucose, UA: NEGATIVE mg/dL
Hgb urine dipstick: NEGATIVE
Ketones, ur: NEGATIVE mg/dL
Leukocytes,Ua: NEGATIVE
Nitrite: NEGATIVE
Protein, ur: NEGATIVE mg/dL
Specific Gravity, Urine: 1.016 (ref 1.005–1.030)
pH: 6 (ref 5.0–8.0)

## 2023-10-19 LAB — COMPREHENSIVE METABOLIC PANEL WITH GFR
ALT: 14 U/L (ref 0–44)
AST: 21 U/L (ref 15–41)
Albumin: 3 g/dL — ABNORMAL LOW (ref 3.5–5.0)
Alkaline Phosphatase: 55 U/L (ref 38–126)
Anion gap: 11 (ref 5–15)
BUN: 11 mg/dL (ref 6–20)
CO2: 19 mmol/L — ABNORMAL LOW (ref 22–32)
Calcium: 8.6 mg/dL — ABNORMAL LOW (ref 8.9–10.3)
Chloride: 108 mmol/L (ref 98–111)
Creatinine, Ser: 0.6 mg/dL (ref 0.44–1.00)
GFR, Estimated: >60 mL/min (ref >60)
Glucose, Bld: 120 mg/dL — ABNORMAL HIGH (ref 70–99)
Potassium: 3.5 mmol/L (ref 3.5–5.1)
Sodium: 138 mmol/L (ref 135–145)
Total Bilirubin: 0.5 mg/dL (ref 0.0–1.2)
Total Protein: 6.1 g/dL — ABNORMAL LOW (ref 6.5–8.1)

## 2023-10-19 LAB — LIPASE, BLOOD: Lipase: 41 U/L (ref 11–51)

## 2023-10-19 LAB — CBC
HCT: 37.5 % (ref 36.0–46.0)
Hemoglobin: 12.7 g/dL (ref 12.0–15.0)
MCH: 29.8 pg (ref 26.0–34.0)
MCHC: 33.9 g/dL (ref 30.0–36.0)
MCV: 88 fL (ref 80.0–100.0)
Platelets: 227 K/uL (ref 150–400)
RBC: 4.26 MIL/uL (ref 3.87–5.11)
RDW: 13 % (ref 11.5–15.5)
WBC: 9.1 K/uL (ref 4.0–10.5)
nRBC: 0 % (ref 0.0–0.2)

## 2023-10-19 MED ORDER — LIDOCAINE 5 % EX PTCH
1.0000 | MEDICATED_PATCH | CUTANEOUS | Status: DC
  Administered 2023-10-19 (×2): 1 via TRANSDERMAL
  Filled 2023-10-19: qty 1

## 2023-10-19 MED ORDER — CEPHALEXIN 500 MG PO CAPS: 500.0000 mg | ORAL_CAPSULE | Freq: Two times a day (BID) | ORAL | 0 refills | Status: AC

## 2023-10-19 MED ORDER — ACETAMINOPHEN 325 MG PO TABS
975.0000 mg | ORAL_TABLET | Freq: Once | ORAL | Status: AC
  Administered 2023-10-19 (×2): 975 mg via ORAL
  Filled 2023-10-19: qty 3

## 2023-10-19 NOTE — Discharge Instructions (Signed)
 You are seen in the emergency room today for evaluation of your back pain and burning when you pee.  Your testing fortunately did not show an emergency cause for this.  You can continue to take Tylenol  and use over-the-counter lidocaine  patches to help with your pain.  Your urine here did not obviously look infected, but I sent a prescription for an antibiotic to your pharmacy while we await your culture results.  Follow-up with your OB/GYN for further evaluation.  Return to the ER for new or worsening symptoms.

## 2023-10-19 NOTE — ED Provider Notes (Signed)
 [facility] Provider Note    Event Date/Time   First MD Initiated Contact with Patient 10/19/23 1022     (approximate)   History   Back Pain   HPI  Courtney Newman is a 38 year old female at [redacted] weeks gestation presenting to the emergency department for evaluation of low back pain and dysuria.  Patient reports that over the past several weeks she has had low back pain that has been getting progressively worse.  No incontinence or difficulty emptying her bladder.  No fevers.  No identifiable trauma.  She has had an ultrasound in this pregnancy confirming IUP.  She does report that over the past few days she has noticed dysuria when she pees.  No hematuria.  Reports her back pain is throughout her lower back, worse with positional changes.  No reported vaginal bleeding, gushes of fluid, other pregnancy concerns.    Physical Exam   Triage Vital Signs: ED Triage Vitals  Encounter Vitals Group     BP 10/19/23 0857 96/78     Girls Systolic BP Percentile --      Girls Diastolic BP Percentile --      Boys Systolic BP Percentile --      Boys Diastolic BP Percentile --      Pulse Rate 10/19/23 0857 (!) 106     Resp 10/19/23 0857 16     Temp 10/19/23 0857 98.4 F (36.9 C)     Temp Source 10/19/23 0857 Oral     SpO2 10/19/23 0857 100 %     Weight 10/19/23 0900 136 lb (61.7 kg)     Height --      Head Circumference --      Peak Flow --      Pain Score 10/19/23 0859 8     Pain Loc --      Pain Education --      Exclude from Growth Chart --     Most recent vital signs: Vitals:   10/19/23 0857  BP: 96/78  Pulse: (!) 106  Resp: 16  Temp: 98.4 F (36.9 C)  SpO2: 100%     General: Awake, interactive  CV:  Regular rate, good peripheral perfusion.  Resp:  Unlabored respirations.  Abd:  Nondistended, soft nontender Back:  Mild tenderness to palpation throughout the lower back most notably in the paraspinous musculature Neuro:  Symmetric facial  movement, fluid speech   ED Results / Procedures / Treatments   Labs (all labs ordered are listed, but only abnormal results are displayed) Labs Reviewed  COMPREHENSIVE METABOLIC PANEL WITH GFR - Abnormal; Notable for the following components:      Result Value   CO2 19 (*)    Glucose, Bld 120 (*)    Calcium  8.6 (*)    Total Protein 6.1 (*)    Albumin 3.0 (*)    All other components within normal limits  URINALYSIS, ROUTINE W REFLEX MICROSCOPIC - Abnormal; Notable for the following components:   Color, Urine YELLOW (*)    APPearance CLEAR (*)    All other components within normal limits  URINE CULTURE  LIPASE, BLOOD  CBC     EKG EKG independently reviewed and interpreted by myself demonstrates:    RADIOLOGY Imaging independently reviewed and interpreted by myself demonstrates:   Formal Radiology Read:  No results found.  PROCEDURES:  Critical Care performed: No  Procedures   MEDICATIONS ORDERED IN ED: Medications  lidocaine  (LIDODERM ) 5 % 1 patch (1 patch  Transdermal Patch Applied 10/19/23 1103)  acetaminophen  (TYLENOL ) tablet 975 mg (975 mg Oral Given 10/19/23 1104)     IMPRESSION / MDM / ASSESSMENT AND PLAN / ED COURSE  I reviewed the triage vital signs and the nursing notes.  Differential diagnosis includes, but is not limited to, UTI, round ligament pain, musculoskeletal strain, low suspicion appendicitis or other acute intra equina-abdominal process given reassuring abdominal exam, no evidence of cauda equina or other acute spinal cord compression  Patient's presentation is most consistent with acute presentation with potential threat to life or bodily function.  38 year old female presenting to the emergency department for evaluation several weeks of low back pain, more recently dysuria.  Exam seems most consistent with musculoskeletal back pain.  Labs here with reassuring CBC, CMP without significant derangements.  Normal lipase.  Urine is not suggestive  of infection, but in the setting of pregnancy will send culture.  With her history of dysuria,  discussed empiric treatment of UTI and patient is agreeable.  She did feel improved meant in her back pain with Tylenol  and lidocaine  patch.  She is comfortable discharge home and outpatient follow-up with OB/GYN.  Strict return precautions provided.  Patient discharged stable condition.      FINAL CLINICAL IMPRESSION(S) / ED DIAGNOSES   Final diagnoses:  Bilateral low back pain without sciatica, unspecified chronicity  Dysuria     Rx / DC Orders   ED Discharge Orders          Ordered    cephALEXin  (KEFLEX ) 500 MG capsule  2 times daily        10/19/23 1230             Note:  This document was prepared using Dragon voice recognition software and may include unintentional dictation errors.   Levander Slate, MD 10/19/23 [phone]

## 2023-10-19 NOTE — ED Triage Notes (Addendum)
 C/O low back since Friday. Denies injury.  C/O Burning with urination.  Patient is [redacted] weeks pregnant.  EDC:  04/2023 G4  P3  AAOx3.  Skin warm and dry. NAD

## 2024-03-14 NOTE — Progress Notes (Signed)
 " Chief Complaint:    [name] is a 39 y.o. [phone] female here for consultation regarding delivery planning of her current pregnancy. [redacted]w[redacted]d   She is a patient of [facility].  She has a hx of prior Cesarean with a successful VBAC in 2023 and would like another TOLAC.  She did have a BTL with me in 01/2022 with confirmation of bilateral tubes on pathology. Difficult surgical abdomen.    OB/GYN History:  OB History     Gravida  4   Para  3   Term  2   Preterm  1   AB      Living  3      SAB      IAB      Ectopic      Molar      Multiple      Live Births  3          Past Medical History:  has no past medical history on file.  Past Surgical History:  has a past surgical history that includes Cesarean section; Cesarean section; Abdominoplasty; and Tubal ligation. Family History: family history includes Diabetes in her mother. Social History:  reports that she has never smoked. She has never used smokeless tobacco. She reports that she does not currently use alcohol. She reports that she does not use drugs.  Allergies: has no known allergies. Medications:  Current Outpatient Medications:    acetaminophen  (TYLENOL ) 325 MG tablet, Take by mouth, Disp: , Rfl:    aspirin 81 MG EC tablet, , Disp: , Rfl:    famotidine  (PEPCID ) 20 MG tablet, , Disp: , Rfl:    ibuprofen  (MOTRIN ) 600 MG tablet, Take 600 mg by mouth every 6 (six) hours, Disp: , Rfl:    PNV/ferrous fum/docusate/folic (PRENATAL VIT-FE FUM-FA-DSS ORAL), Take by mouth, Disp: , Rfl:    hydrocortisone (ANUSOL-HC) 25 mg suppository, Place 1 suppository (25 mg total) rectally 2 (two) times daily as needed for Hemorrhoids (Patient not taking: Reported on 03/14/2024), Disp: 20 suppository, Rfl: 0   omeprazole  (PRILOSEC) 40 MG DR capsule, Take 1 capsule (40 mg total) by mouth once daily (Patient not taking: Reported on 03/14/2024), Disp: 90 capsule, Rfl: 3   sennosides-docusate (SENOKOT-S) 8.6-50 mg tablet, Take  2 tablets by mouth once daily (Patient not taking: Reported on 03/14/2024), Disp: , Rfl:    simethicone  (MYLICON) 80 MG chewable tablet, Take by mouth (Patient not taking: Reported on 03/18/2022), Disp: , Rfl:   Review of Systems: General:   No fatigue or weight loss Eyes:   No vision changes Ears:   No hearing difficulty Respiratory:                No cough or shortness of breath Pulmonary:   No asthma or shortness of breath Cardiovascular:        No chest pain, palpitations, dyspnea on exertion Gastrointestinal:          No abdominal bloating, chronic diarrhea, constipations, masses, pain or hematochezia Genitourinary:  No hematuria, dysuria, abnormal vaginal discharge, pelvic pain, Menometrorrhagia Lymphatic:  No swollen lymph nodes Musculoskeletal: No muscle weakness Neurologic:  No extremity weakness, syncope, seizure disorder Psychiatric:  No history of depression, delusions or suicidal/homicidal ideation    Exam:   Vitals:   03/14/24 1116  BP: 114/76  Pulse: 80    Body mass index is 34.21 kg/m.  Constitutional:  General appearance: Well nourished, well developed female in no acute distress.  Neuro/psych:  Normal mood  and affect. No gross motor deficits. Neck:  Supple, normal appearance.  Respiratory:  Normal respiratory effort, no use of accessory muscles Skin:  No visible rashes or external lesions    Pelvic: deferred   Impression:   The encounter diagnosis was Desires VBAC (vaginal birth after cesarean) trial ([facility]).    Plan:   I personally performed a full review of the patient's pregnancy records today.  Risk of uterine rupture at term is 0.78 percent with TOLAC and 0.22 percent with ERCD. 1 in 10 uterine ruptures will result in neonatal death or neurological injury. The benefits of a trial of labor after cesarean (TOLAC) resulting in a vaginal birth after cesarean (VBAC) include the following: shorter length of hospital stay and postpartum recovery (in  most cases); fewer complications, such as postpartum fever, wound or uterine infection, thromboembolism (blood clots in the leg or lung), need for blood transfusion and fewer neonatal breathing problems. The risks of an attempted VBAC or TOLAC include the following: Risk of failed trial of labor after cesarean (TOLAC) without a vaginal birth after cesarean (VBAC) resulting in repeat cesarean delivery (RCD) in about 20 to 40 percent of women who attempt VBAC.  Her individualized success rate using the MFMU VBAC risk calculator is 30%.   Risk of rupture of uterus resulting in an emergency cesarean delivery. The risk of uterine rupture may be related in part to the type of uterine incision made during the first cesarean delivery. A previous transverse uterine incision has the lowest risk of rupture (0.2 to 1.5 percent risk). Vertical or T-shaped uterine incisions have a higher risk of uterine rupture (4 to 9 percent risk)The risk of fetal death is very low with both VBAC and elective repeat cesarean delivery (ERCD), but the likelihood of fetal death is higher with VBAC than with ERCD. Maternal death is very rare with either type of delivery.  The risks of an elective repeat cesarean delivery (ERCD) were reviewed with the patient including but not limited to: 04/998 risk of uterine rupture which could have serious consequences, bleeding which may require transfusion; infection which may require antibiotics; injury to bowel, bladder or other surrounding organs (bowel, bladder, ureters); injury to the fetus; need for additional procedures including hysterectomy in the event of a life-threatening hemorrhage; thromboembolic phenomenon; abnormal placentation; incisional problems; death and other postoperative or anesthesia complications.     In addition we discussed that our collective office practice is to allow patients who desire to attempt TOLAC to go into labor naturally.  Medical indications necessetating early  delivery may arise during the course of any pregnancy.  Given the contraindication on the use of prostaglandins for use in cervical ripening,  recommendation would be to proceed with repeat cesarean for delivery for patients with unfavorable cervix (low Bishops score) who reach 41 weeks or who otherwise have a medical indication for early delivery.   She has successfully given birth vaginally after her c/s and there is no indication for induction. Assessment & Plan Routine Prenatal Care Currently 35 weeks weeks pregnant with a due date of February 12th. Previous cesarean delivery with successful vaginal delivery post-cesarean. Desires vaginal delivery if possible. Planning for epidural and breastfeeding. Aware of induction option if labor does not start naturally after membrane rupture. Prefers partner not present during delivery due to childcare responsibilities. - Plan for vaginal delivery if possible. - Plan for epidural during labor. - Plan for breastfeeding post-delivery. - Discussed induction of labor if necessary.  Postpartum IUD Contraception  Planning Desires long-term contraception with Mirena IUD, effective for up to eight years. Plans to wait several weeks post-delivery for insertion. - Plan for Mirena IUD insertion several weeks post-delivery.  History of Cesarean Delivery One previous cesarean delivery with successful vaginal delivery post-cesarean. Aware of option for cesarean if necessary but prefers vaginal delivery. - Plan for cesarean delivery if vaginal delivery is not possible and I would try to remove any tubal remnants if found.    Return if symptoms worsen or fail to improve.  BETHANY EVANGELINE BEASLEY, MD      "

## 2024-04-04 ENCOUNTER — Observation Stay

## 2024-04-04 ENCOUNTER — Observation Stay
Admission: EM | Admit: 2024-04-04 | Discharge: 2024-04-04 | Disposition: A | Discharge status: Home / Self Care | Source: Ambulatory Visit | Attending: Certified Nurse Midwife | Admitting: Obstetrics and Gynecology

## 2024-04-04 ENCOUNTER — Encounter: Payer: Self-pay | Admitting: Obstetrics and Gynecology

## 2024-04-04 ENCOUNTER — Other Ambulatory Visit: Payer: Self-pay

## 2024-04-04 DIAGNOSIS — O0993 Supervision of high risk pregnancy, unspecified, third trimester: Secondary | ICD-10-CM | POA: Diagnosis not present

## 2024-04-04 DIAGNOSIS — Z3A37 37 weeks gestation of pregnancy: Secondary | ICD-10-CM | POA: Insufficient documentation

## 2024-04-04 DIAGNOSIS — O368190 Decreased fetal movements, unspecified trimester, not applicable or unspecified: Principal | ICD-10-CM | POA: Diagnosis present

## 2024-04-04 DIAGNOSIS — O368130 Decreased fetal movements, third trimester, not applicable or unspecified: Secondary | ICD-10-CM | POA: Diagnosis present

## 2024-04-04 MED ORDER — CALCIUM CARBONATE ANTACID 500 MG PO CHEW: 2.0000 | CHEWABLE_TABLET | ORAL | Status: DC | PRN

## 2024-04-04 MED ORDER — ACETAMINOPHEN 325 MG PO TABS: 650.0000 mg | ORAL_TABLET | ORAL | Status: DC | PRN

## 2024-04-04 MED ORDER — ZOLPIDEM TARTRATE 5 MG PO TABS: 5.0000 mg | ORAL_TABLET | Freq: Every evening | ORAL | Status: DC | PRN

## 2024-04-04 NOTE — OB Triage Note (Signed)

## 2024-04-04 NOTE — Discharge Summary (Signed)
 " Courtney Newman is a 39 y.o. female. She is at [redacted]w[redacted]d gestation by [facility] . Patient's last menstrual period was 07/16/2023. Estimated Date of Delivery: None noted.  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 She reports she is still feeling baby move, it is just less than she is used to.  S: Resting comfortably. no CTX, no VB.no LOF,  Active fetal movement noted.  Denies: HA, visual changes, SOB, or RUQ/epigastric pain  Maternal Medical History:   Past Medical History:  Diagnosis Date   Ear infection 06/2021   tx with Amoxicillin    Preterm labor     Past Surgical History:  Procedure Laterality Date   CESAREAN SECTION  09/09/2013   PLACEMENT OF BREAST IMPLANTS Bilateral 2017   TUBAL LIGATION N/A 01/17/2022   Procedure: POST PARTUM TUBAL LIGATION CONVERSION TO LAPAROSCOPY;  Surgeon: Verdon Keen, MD;  Location: [facility] ORS;  Service: Gynecology;  Laterality: N/A;    Allergies[1]  Prior to Admission medications  Medication Sig Start Date End Date Taking? Authorizing Provider  aspirin EC 81 MG tablet Take 81 mg by mouth daily. Swallow whole.   Yes [provider]  Prenatal Vit-Fe Fumarate-FA (PRENATAL MULTIVITAMIN) TABS tablet Take 1 tablet by mouth daily at 12 noon.   Yes [provider]  acetaminophen  (TYLENOL ) 325 MG tablet Take 2 tablets (650 mg total) by mouth every 4 (four) hours as needed (for pain scale < 4). 01/18/22   McVey, Asberry LABOR, CNM  aluminum -magnesium  hydroxide-simethicone  (MAALOX) 200-200-20 MG/5ML SUSP Take 30 mLs by mouth 4 (four) times daily -  before meals and at bedtime. 11/16/22   White, Shelba SAUNDERS, NP  benzocaine -Menthol  (DERMOPLAST) 20-0.5 % AERO Apply 1 Application topically as needed for  irritation (perineal discomfort). Patient not taking: Reported on 02/03/2022 01/18/22   McVey, Asberry A, CNM  coconut oil OIL Apply 1 Application topically as needed. Patient not taking: Reported on 02/03/2022 01/18/22   McVey, Asberry LABOR, CNM  dicyclomine  (BENTYL ) 10 MG capsule Take 1 capsule (10 mg total) by mouth 3 (three) times daily as needed for spasms. 08/30/22   Dorothyann Drivers, MD  famotidine  (PEPCID ) 10 MG tablet Take 10 mg by mouth daily. Prn use Patient not taking: Reported on 02/03/2022    [provider]  ibuprofen  (ADVIL ) 600 MG tablet Take 1 tablet (600 mg total) by mouth every 6 (six) hours. Patient not taking: Reported on 02/03/2022 01/18/22   McVey, Asberry LABOR, CNM  mineral oil enema Place 1 enema rectally once.    [provider]  omeprazole  (PRILOSEC) 20 MG capsule Take 1 capsule (20 mg total) by mouth daily. 11/16/22   White, Shelba SAUNDERS, NP  ondansetron  (ZOFRAN -ODT) 4 MG disintegrating tablet Take 1 tablet (4 mg total) by mouth every 8 (eight) hours as needed for nausea or vomiting. 11/16/22   White, Adrienne R, NP  senna-docusate (SENOKOT-S) 8.6-50 MG tablet Take 2 tablets by mouth daily. 01/19/22   McVey, Asberry LABOR, CNM  simethicone  (MYLICON) 80 MG chewable tablet Chew 1 tablet (80 mg total) by mouth as needed for flatulence. Patient not taking: Reported on 02/03/2022 01/18/22   McVey, Asberry LABOR, CNM  witch hazel-glycerin  (TUCKS) pad Apply 1 Application topically as needed for hemorrhoids (for pain). Patient not taking: Reported  on 02/03/2022 01/18/22   McVey, Asberry LABOR, CNM    Social History: She  reports that she has never smoked. She has never been exposed to tobacco smoke. She has never used smokeless tobacco. She reports that she does not currently use alcohol. She reports that she does not use drugs.  Family History: family history includes Diabetes in her mother; Healthy in her father.  no history of gyn cancers  Review of Systems: A full review  of systems was performed and negative except as noted in the HPI.     O:  BP 121/80 (BP Location: Right Arm)   Pulse 81   Temp 98.4 F (36.9 C) (Oral)   Resp 18   LMP 07/16/2023  No results found for this or any previous visit (from the past 48 hours).   Constitutional: NAD, AAOx3  HE/ENT: extraocular movements grossly intact, moist mucous membranes CV: RRR PULM: nl respiratory effort, CTABL     Abd: gravid, non-tender, non-distended, soft      Ext: Non-tender, Nonedematous   Psych: mood appropriate, speech normal Pelvic: deferred  Fetal  monitoring: Cat 1 Appropriate for GA Baseline: 130bpm Variability: moderate Accelerations: absent/ present x >2 Decelerations absent Time 1 hour  CLINICAL DATA:  Decreased fetal movement.   EXAM: BIOPHYSICAL PROFILE   FINDINGS: Number of Fetuses: 1   Heart Rate: 129 bpm   Presentation: Cephalic   Movement: 2 time: 19 minutes   Breathing: 2   Tone: 2   Amniotic Fluid: 2   Total Score: 8   IMPRESSION: Biophysical profile score of 8.     Electronically Signed   By: Vanetta Chou M.D.   On: 04/04/2024 14:24  A/P: 39 y.o. [redacted]w[redacted]d here for antenatal surveillance for decreased fetal movement  Principle Diagnosis:  High risk pregnancy in third trimester, normal fetal movement  Decreased fetal movement: she is feeling normal fetal movement, Category I fetal tracing x 1 hr with BPP 8/8 Fetal Wellbeing: Reassuring Cat 1 tracing. D/c home stable, precautions reviewed, follow-up as scheduled.    Edsel Charlies Blush, CNM 04/04/2024 2:57 PM     [1] No Known Allergies  "

## 2024-04-04 NOTE — OB Triage Note (Signed)
 39 y.o G4P3 presents to L&D triage for decreased fetal movement after being seen at [facility] today. She reports last feeling her baby ~2 hours ago once or twice. She denies ctx's, vaginal bleeding, LOF. She denies complications this pregnancy other than AMA and Obesity. Monitors applied and assessing with category 1 strip. Vitals are wnl. Tanda, CNM aware of pt arrival.
